# Patient Record
Sex: Female | Born: 1970 | Race: Black or African American | Hispanic: No | Marital: Married | Smoking: Never smoker
Health system: Southern US, Community
[De-identification: ages and names within clinical notes are randomized; demographics above are authoritative.]

## PROBLEM LIST (undated history)

## (undated) DIAGNOSIS — Z862 Personal history of diseases of the blood and blood-forming organs and certain disorders involving the immune mechanism: Secondary | ICD-10-CM

## (undated) DIAGNOSIS — Z8619 Personal history of other infectious and parasitic diseases: Secondary | ICD-10-CM

## (undated) DIAGNOSIS — B009 Herpesviral infection, unspecified: Secondary | ICD-10-CM

## (undated) DIAGNOSIS — A630 Anogenital (venereal) warts: Secondary | ICD-10-CM

## (undated) DIAGNOSIS — Z87898 Personal history of other specified conditions: Secondary | ICD-10-CM

## (undated) DIAGNOSIS — E28319 Asymptomatic premature menopause: Secondary | ICD-10-CM

## (undated) DIAGNOSIS — K8020 Calculus of gallbladder without cholecystitis without obstruction: Secondary | ICD-10-CM

## (undated) DIAGNOSIS — R896 Abnormal cytological findings in specimens from other organs, systems and tissues: Secondary | ICD-10-CM

## (undated) DIAGNOSIS — Z8742 Personal history of other diseases of the female genital tract: Secondary | ICD-10-CM

## (undated) DIAGNOSIS — B379 Candidiasis, unspecified: Secondary | ICD-10-CM

## (undated) DIAGNOSIS — Z87440 Personal history of urinary (tract) infections: Secondary | ICD-10-CM

## (undated) DIAGNOSIS — Z8739 Personal history of other diseases of the musculoskeletal system and connective tissue: Secondary | ICD-10-CM

## (undated) DIAGNOSIS — K219 Gastro-esophageal reflux disease without esophagitis: Secondary | ICD-10-CM

## (undated) DIAGNOSIS — IMO0002 Reserved for concepts with insufficient information to code with codable children: Secondary | ICD-10-CM

## (undated) HISTORY — DX: Personal history of other specified conditions: Z87.898

## (undated) HISTORY — DX: Personal history of diseases of the blood and blood-forming organs and certain disorders involving the immune mechanism: Z86.2

## (undated) HISTORY — DX: Herpesviral infection, unspecified: B00.9

## (undated) HISTORY — DX: Personal history of other diseases of the female genital tract: Z87.42

## (undated) HISTORY — PX: MOUTH SURGERY: SHX715

## (undated) HISTORY — DX: Anogenital (venereal) warts: A63.0

## (undated) HISTORY — PX: WRIST FRACTURE SURGERY: SHX121

## (undated) HISTORY — DX: Personal history of urinary (tract) infections: Z87.440

## (undated) HISTORY — DX: Abnormal cytological findings in specimens from other organs, systems and tissues: R89.6

## (undated) HISTORY — DX: Asymptomatic premature menopause: E28.319

## (undated) HISTORY — DX: Reserved for concepts with insufficient information to code with codable children: IMO0002

## (undated) HISTORY — DX: Personal history of other infectious and parasitic diseases: Z86.19

## (undated) HISTORY — PX: COLONOSCOPY: SHX174

## (undated) HISTORY — DX: Candidiasis, unspecified: B37.9

---
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
## 1992-03-06 HISTORY — PX: CRYOTHERAPY: SHX1416

## 1992-07-04 DIAGNOSIS — IMO0002 Reserved for concepts with insufficient information to code with codable children: Secondary | ICD-10-CM

## 1992-07-04 HISTORY — DX: Reserved for concepts with insufficient information to code with codable children: IMO0002

## 1998-10-02 ENCOUNTER — Encounter: Payer: Self-pay | Admitting: Emergency Medicine

## 1998-10-02 ENCOUNTER — Emergency Department (HOSPITAL_COMMUNITY): Admission: EM | Admit: 1998-10-02 | Discharge: 1998-10-02 | Payer: Self-pay | Admitting: Emergency Medicine

## 2000-08-07 ENCOUNTER — Other Ambulatory Visit: Admission: RE | Admit: 2000-08-07 | Discharge: 2000-08-07 | Payer: Self-pay | Admitting: *Deleted

## 2001-05-31 ENCOUNTER — Other Ambulatory Visit: Admission: RE | Admit: 2001-05-31 | Discharge: 2001-05-31 | Payer: Self-pay | Admitting: Family Medicine

## 2002-03-06 HISTORY — PX: FOOT SURGERY: SHX648

## 2002-07-09 ENCOUNTER — Other Ambulatory Visit: Admission: RE | Admit: 2002-07-09 | Discharge: 2002-07-09 | Payer: Self-pay | Admitting: Obstetrics and Gynecology

## 2003-07-21 ENCOUNTER — Other Ambulatory Visit: Admission: RE | Admit: 2003-07-21 | Discharge: 2003-07-21 | Payer: Self-pay | Admitting: Obstetrics and Gynecology

## 2004-01-27 ENCOUNTER — Ambulatory Visit (HOSPITAL_COMMUNITY): Admission: RE | Admit: 2004-01-27 | Discharge: 2004-01-27 | Payer: Self-pay | Admitting: Obstetrics and Gynecology

## 2004-02-22 ENCOUNTER — Inpatient Hospital Stay (HOSPITAL_COMMUNITY): Admission: AD | Admit: 2004-02-22 | Discharge: 2004-02-25 | Payer: Self-pay | Admitting: Obstetrics and Gynecology

## 2004-03-02 DIAGNOSIS — Z87898 Personal history of other specified conditions: Secondary | ICD-10-CM

## 2004-03-02 HISTORY — DX: Personal history of other specified conditions: Z87.898

## 2005-04-14 ENCOUNTER — Other Ambulatory Visit: Admission: RE | Admit: 2005-04-14 | Discharge: 2005-04-14 | Payer: Self-pay | Admitting: Obstetrics and Gynecology

## 2006-11-16 DIAGNOSIS — IMO0001 Reserved for inherently not codable concepts without codable children: Secondary | ICD-10-CM

## 2006-11-16 HISTORY — DX: Reserved for inherently not codable concepts without codable children: IMO0001

## 2009-02-15 DIAGNOSIS — E28319 Asymptomatic premature menopause: Secondary | ICD-10-CM

## 2009-02-15 DIAGNOSIS — Z8742 Personal history of other diseases of the female genital tract: Secondary | ICD-10-CM

## 2009-02-15 HISTORY — DX: Asymptomatic premature menopause: E28.319

## 2009-02-15 HISTORY — DX: Personal history of other diseases of the female genital tract: Z87.42

## 2010-04-20 ENCOUNTER — Emergency Department (HOSPITAL_COMMUNITY): Payer: BC Managed Care – PPO

## 2010-04-20 ENCOUNTER — Emergency Department (HOSPITAL_COMMUNITY)
Admission: EM | Admit: 2010-04-20 | Discharge: 2010-04-20 | Disposition: A | Discharge status: Home / Self Care | Payer: BC Managed Care – PPO | Attending: Emergency Medicine | Admitting: Emergency Medicine

## 2010-04-20 DIAGNOSIS — M25539 Pain in unspecified wrist: Secondary | ICD-10-CM | POA: Insufficient documentation

## 2010-04-20 DIAGNOSIS — W108XXA Fall (on) (from) other stairs and steps, initial encounter: Secondary | ICD-10-CM | POA: Insufficient documentation

## 2010-04-20 DIAGNOSIS — S52539A Colles' fracture of unspecified radius, initial encounter for closed fracture: Secondary | ICD-10-CM | POA: Insufficient documentation

## 2011-07-12 ENCOUNTER — Ambulatory Visit: Payer: Self-pay | Admitting: Obstetrics and Gynecology

## 2011-09-06 ENCOUNTER — Ambulatory Visit: Payer: BC Managed Care – PPO | Admitting: Obstetrics and Gynecology

## 2011-09-06 ENCOUNTER — Other Ambulatory Visit: Payer: Self-pay | Admitting: Obstetrics and Gynecology

## 2011-09-11 ENCOUNTER — Encounter: Payer: Self-pay | Admitting: Obstetrics and Gynecology

## 2011-10-30 ENCOUNTER — Ambulatory Visit (INDEPENDENT_AMBULATORY_CARE_PROVIDER_SITE_OTHER): Payer: BC Managed Care – PPO | Admitting: Obstetrics and Gynecology

## 2011-10-30 ENCOUNTER — Encounter: Payer: Self-pay | Admitting: Obstetrics and Gynecology

## 2011-10-30 VITALS — BP 108/60 | HR 88 | Ht 65.5 in | Wt 212.0 lb

## 2011-10-30 DIAGNOSIS — Z01419 Encounter for gynecological examination (general) (routine) without abnormal findings: Secondary | ICD-10-CM

## 2011-10-30 DIAGNOSIS — Z124 Encounter for screening for malignant neoplasm of cervix: Secondary | ICD-10-CM

## 2011-10-30 NOTE — Progress Notes (Signed)
The patient reports:no complaints  Contraception:oral contraceptives (estrogen/progesterone)  Last mammogram: not applicable  Last pap: was abnormal: ASCUS  May  2012  GC/Chlamydia cultures offered: declined HIV/RPR/HbsAg offered:  declined HSV 1 and 2 glycoprotein offered: declined  Menstrual cycle regular and monthly: Yes Menstrual flow normal: No: lighter than normal  Urinary symptoms: none Normal bowel movements: Yes Reports abuse at home: No:   Subjective:    Samantha Rollins is a 41 y.o. female, G2P1011, who presents for an annual exam. Ortho-tricyclen and satisfied    History   Social History  . Marital Status: Married    Spouse Name: N/A    Number of Children: N/A  . Years of Education: N/A   Social History Main Topics  . Smoking status: Never Smoker   . Smokeless tobacco: Never Used  . Alcohol Use: No  . Drug Use: No  . Sexually Active: Yes    Birth Control/ Protection: Pill     orthotricyclen   Other Topics Concern  . None   Social History Narrative  . None    Menstrual cycle:   LMP: Patient's last menstrual period was 10/12/2011.           Cycle: normal and light  The following portions of the patient's history were reviewed and updated as appropriate: allergies, current medications, past family history, past medical history, past social history, past surgical history and problem list.  Review of Systems Pertinent items are noted in HPI. Breast:Negative for breast lump,nipple discharge or nipple retraction Gastrointestinal: Negative for abdominal pain, change in bowel habits or rectal bleeding Urinary:negative   Objective:    BP 108/60  Pulse 88  Ht 5' 5.5" (1.664 m)  Wt 212 lb (96.163 kg)  BMI 34.74 kg/m2  LMP 10/12/2011    Weight:  Wt Readings from Last 1 Encounters:  10/30/11 212 lb (96.163 kg)          BMI: Body mass index is 34.74 kg/(m^2).  General Appearance: Alert, appropriate appearance for age. No acute distress HEENT: Grossly  normal Neck / Thyroid: Supple, no masses, nodes or enlargement Lungs: clear to auscultation bilaterally Back: No CVA tenderness Breast Exam: No masses or nodes.No dimpling, nipple retraction or discharge. Cardiovascular: Regular rate and rhythm. S1, S2, no murmur Gastrointestinal: Soft, non-tender, no masses or organomegaly Pelvic Exam: Vulva and vagina appear normal. Bimanual exam reveals normal uterus and adnexa. Rectovaginal: not indicated Lymphatic Exam: Non-palpable nodes in neck, clavicular, axillary, or inguinal regions Skin: no rash or abnormalities Neurologic: Normal gait and speech, no tremor  Psychiatric: Alert and oriented, appropriate affect.     Assessment:    Normal gyn exam    Plan:    mammogram pap smear return annually or prn STD screening: declined Contraception:oral contraceptives (estrogen/progesterone): Ortho-tricyclen     MD

## 2011-11-01 LAB — PAP IG W/ RFLX HPV ASCU

## 2012-11-11 ENCOUNTER — Other Ambulatory Visit: Payer: Self-pay

## 2012-11-11 DIAGNOSIS — Z1231 Encounter for screening mammogram for malignant neoplasm of breast: Secondary | ICD-10-CM

## 2012-11-26 ENCOUNTER — Ambulatory Visit
Admission: RE | Admit: 2012-11-26 | Discharge: 2012-11-26 | Disposition: A | Discharge status: Home / Self Care | Payer: BC Managed Care – PPO | Source: Ambulatory Visit

## 2012-11-26 DIAGNOSIS — Z1231 Encounter for screening mammogram for malignant neoplasm of breast: Secondary | ICD-10-CM

## 2012-11-28 ENCOUNTER — Other Ambulatory Visit: Payer: Self-pay | Admitting: Obstetrics and Gynecology

## 2012-11-28 DIAGNOSIS — R928 Other abnormal and inconclusive findings on diagnostic imaging of breast: Secondary | ICD-10-CM

## 2012-12-12 ENCOUNTER — Ambulatory Visit
Admission: RE | Admit: 2012-12-12 | Discharge: 2012-12-12 | Disposition: A | Discharge status: Home / Self Care | Payer: BC Managed Care – PPO | Source: Ambulatory Visit | Attending: Obstetrics and Gynecology | Admitting: Obstetrics and Gynecology

## 2012-12-12 DIAGNOSIS — R928 Other abnormal and inconclusive findings on diagnostic imaging of breast: Secondary | ICD-10-CM

## 2013-12-04 ENCOUNTER — Other Ambulatory Visit: Payer: Self-pay

## 2013-12-04 DIAGNOSIS — Z1239 Encounter for other screening for malignant neoplasm of breast: Secondary | ICD-10-CM

## 2013-12-17 ENCOUNTER — Encounter (INDEPENDENT_AMBULATORY_CARE_PROVIDER_SITE_OTHER): Payer: Self-pay

## 2013-12-17 ENCOUNTER — Ambulatory Visit
Admission: RE | Admit: 2013-12-17 | Discharge: 2013-12-17 | Disposition: A | Discharge status: Home / Self Care | Payer: BC Managed Care – PPO | Source: Ambulatory Visit

## 2013-12-17 DIAGNOSIS — Z1239 Encounter for other screening for malignant neoplasm of breast: Secondary | ICD-10-CM

## 2014-01-05 ENCOUNTER — Encounter: Payer: Self-pay | Admitting: Obstetrics and Gynecology

## 2014-08-31 ENCOUNTER — Encounter (HOSPITAL_COMMUNITY): Payer: Self-pay | Admitting: Emergency Medicine

## 2014-08-31 ENCOUNTER — Emergency Department (HOSPITAL_COMMUNITY): Payer: BLUE CROSS/BLUE SHIELD

## 2014-08-31 ENCOUNTER — Inpatient Hospital Stay (HOSPITAL_COMMUNITY)
Admission: EM | Admit: 2014-08-31 | Discharge: 2014-09-01 | DRG: 176 | Disposition: A | Discharge status: Home / Self Care | Payer: BLUE CROSS/BLUE SHIELD | Attending: Internal Medicine | Admitting: Internal Medicine

## 2014-08-31 DIAGNOSIS — I2699 Other pulmonary embolism without acute cor pulmonale: Secondary | ICD-10-CM | POA: Diagnosis not present

## 2014-08-31 DIAGNOSIS — D72829 Elevated white blood cell count, unspecified: Secondary | ICD-10-CM | POA: Diagnosis present

## 2014-08-31 DIAGNOSIS — E875 Hyperkalemia: Secondary | ICD-10-CM | POA: Diagnosis present

## 2014-08-31 DIAGNOSIS — M25512 Pain in left shoulder: Secondary | ICD-10-CM | POA: Diagnosis present

## 2014-08-31 DIAGNOSIS — D573 Sickle-cell trait: Secondary | ICD-10-CM | POA: Diagnosis present

## 2014-08-31 DIAGNOSIS — Z79899 Other long term (current) drug therapy: Secondary | ICD-10-CM | POA: Diagnosis not present

## 2014-08-31 HISTORY — DX: Other pulmonary embolism without acute cor pulmonale: I26.99

## 2014-08-31 LAB — BASIC METABOLIC PANEL
Anion gap: 9 (ref 5–15)
BUN: 8 mg/dL (ref 6–20)
CHLORIDE: 104 mmol/L (ref 101–111)
CO2: 22 mmol/L (ref 22–32)
CREATININE: 0.84 mg/dL (ref 0.44–1.00)
Calcium: 8.9 mg/dL (ref 8.9–10.3)
GFR calc Af Amer: >60 mL/min (ref >60)
GFR calc non Af Amer: >60 mL/min (ref >60)
Glucose, Bld: 102 mg/dL — ABNORMAL HIGH (ref 65–99)
Potassium: 5.3 mmol/L — ABNORMAL HIGH (ref 3.5–5.1)
SODIUM: 135 mmol/L (ref 135–145)

## 2014-08-31 LAB — CBC
HCT: 37.4 % (ref 36.0–46.0)
HEMOGLOBIN: 12.8 g/dL (ref 12.0–15.0)
MCH: 27.9 pg (ref 26.0–34.0)
MCHC: 34.2 g/dL (ref 30.0–36.0)
MCV: 81.7 fL (ref 78.0–100.0)
Platelets: 427 10*3/uL — ABNORMAL HIGH (ref 150–400)
RBC: 4.58 MIL/uL (ref 3.87–5.11)
RDW: 12.7 % (ref 11.5–15.5)
WBC: 12.3 10*3/uL — AB (ref 4.0–10.5)

## 2014-08-31 LAB — I-STAT BETA HCG BLOOD, ED (MC, WL, AP ONLY)

## 2014-08-31 LAB — I-STAT TROPONIN, ED: Troponin i, poc: 0 ng/mL (ref 0.00–0.08)

## 2014-08-31 MED ORDER — SODIUM CHLORIDE 0.9 % IV BOLUS (SEPSIS)
1000.0000 mL | Freq: Once | INTRAVENOUS | Status: AC
  Administered 2014-08-31: 1000 mL via INTRAVENOUS

## 2014-08-31 MED ORDER — HEPARIN BOLUS VIA INFUSION
5000.0000 [IU] | Freq: Once | INTRAVENOUS | Status: AC
  Administered 2014-08-31: 5000 [IU] via INTRAVENOUS
  Filled 2014-08-31: qty 5000

## 2014-08-31 MED ORDER — SODIUM CHLORIDE 0.9 % IV SOLN
INTRAVENOUS | Status: DC
  Administered 2014-09-01: 125 mL/h via INTRAVENOUS

## 2014-08-31 MED ORDER — MORPHINE SULFATE 4 MG/ML IJ SOLN
4.0000 mg | Freq: Once | INTRAMUSCULAR | Status: AC
  Administered 2014-08-31: 2 mg via INTRAVENOUS
  Filled 2014-08-31: qty 1

## 2014-08-31 MED ORDER — IOHEXOL 350 MG/ML SOLN
100.0000 mL | Freq: Once | INTRAVENOUS | Status: AC | PRN
  Administered 2014-08-31: 100 mL via INTRAVENOUS

## 2014-08-31 MED ORDER — HEPARIN (PORCINE) IN NACL 100-0.45 UNIT/ML-% IJ SOLN
1350.0000 [IU]/h | INTRAMUSCULAR | Status: DC
  Administered 2014-08-31: 1350 [IU]/h via INTRAVENOUS
  Filled 2014-08-31 (×2): qty 250

## 2014-08-31 NOTE — ED Provider Notes (Signed)
CSN: [id number]     Arrival date & time 08/31/14  1729 History   First MD Initiated Contact with Patient 08/31/14 1902     Chief Complaint  Patient presents with  . Shortness of Breath     (Consider location/radiation/quality/duration/timing/severity/associated sxs/prior Treatment) Patient is a 44 y.o. female presenting with shoulder pain.  Shoulder Pain Location:  Shoulder Time since incident:  31 hours Injury: no   Shoulder location:  L shoulder Pain details:    Quality:  Aching   Radiates to:  Does not radiate   Severity:  Moderate   Onset quality:  Sudden   Timing:  Constant   Progression:  Unchanged Chronicity:  New Handedness:  Right-handed Dislocation: no   Relieved by:  Rest Worsened by:  Movement (and breathing) Ineffective treatments:  NSAIDs Associated symptoms: no back pain, no decreased range of motion, no fatigue, no fever, no muscle weakness, no neck pain, no numbness, no stiffness, no swelling and no tingling     Past Medical History  Diagnosis Date  . Herpes simplex type 2 infection   . 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 Hx of candidiasis   . H/O varicella   . Hx: UTI (urinary tract infection)   . H/O sickle cell trait   . Abnormal Pap smear 1994-5  . Yeast infection   . H/O bacterial infection   . H/O diarrhea 03/02/04  . ASCUS (atypical squamous cells of undetermined significance) on Pap smear 11/16/06  . Hx of amenorrhea 02/15/09  . Premature menopause 02/15/09   Past Surgical History  Procedure Laterality Date  . Mandible surgery  1993  . Foot surgery  2004  . Cryotherapy  1994   Family History  Problem Relation Age of Onset  . Hypertension Mother   . Heart disease Maternal Grandmother     MI   History  Substance Use Topics  . Smoking status: Never Smoker   . Smokeless tobacco: Never Used  . Alcohol Use: No   OB History    Gravida Para Term Preterm AB TAB SAB Ectopic Multiple Living   2 1 1  1 1    1      Review of Systems   Constitutional: Negative for fever, chills, appetite change and fatigue.  HENT: Negative for congestion, ear pain, facial swelling, mouth sores and sore throat.   Eyes: Negative for visual disturbance.  Respiratory: Negative for cough, chest tightness and shortness of breath.   Cardiovascular: Negative for chest pain and palpitations.  Gastrointestinal: Negative for nausea, vomiting, abdominal pain, diarrhea and blood in stool.  Endocrine: Negative for cold intolerance and heat intolerance.  Genitourinary: Negative for frequency, decreased urine volume and difficulty urinating.  Musculoskeletal: Negative for back pain, stiffness, neck pain and neck stiffness.  Skin: Negative for rash.  Neurological: Negative for dizziness, weakness, light-headedness and headaches.  All other systems reviewed and are negative.     Allergies  Review of patient's allergies indicates no known allergies.  Home Medications   Prior to Admission medications   Medication Sig Start Date End Date Taking? Authorizing Provider  TRI-PREVIFEM 0.18/0.215/0.25 MG-35 MCG tablet TAKE 1 TABLET DAILY 09/06/11  Yes Silverio Lay, MD   BP 130/47 mmHg  Pulse 103  Temp(Src) 99.2 F (37.3 C) (Oral)  Resp 18  Ht 5\' 5"  (1.651 m)  Wt 227 lb 3.2 oz (103.057 kg)  BMI 37.81 kg/m2  SpO2 98%  LMP 07/31/2014 Physical Exam  Constitutional: She is oriented to person, place, and  time. She appears well-developed and well-nourished. No distress.  HENT:  Head: Normocephalic and atraumatic.  Right Ear: External ear normal.  Left Ear: External ear normal.  Nose: Nose normal.  Eyes: Conjunctivae and EOM are normal. Pupils are equal, round, and reactive to light. Right eye exhibits no discharge. Left eye exhibits no discharge. No scleral icterus.  Neck: Normal range of motion. Neck supple.  Cardiovascular: Normal rate, regular rhythm and normal heart sounds.  Exam reveals no gallop and no friction rub.   No murmur  heard. Pulmonary/Chest: Effort normal and breath sounds normal. No stridor. No respiratory distress. She has no wheezes.  Abdominal: Soft. She exhibits no distension. There is no tenderness.  Musculoskeletal: She exhibits no edema.       Left shoulder: She exhibits tenderness. Decreased range of motion: to middle fibers of left Trap.  Neurological: She is alert and oriented to person, place, and time.  Skin: Skin is warm and dry. No rash noted. She is not diaphoretic. No erythema.  Psychiatric: She has a normal mood and affect.    ED Course  Procedures (including critical care time) Labs Review Labs Reviewed  CBC - Abnormal; Notable for the following:    WBC 12.3 (*)    Platelets 427 (*)    All other components within normal limits  BASIC METABOLIC PANEL - Abnormal; Notable for the following:    Potassium 5.3 (*)    Glucose, Bld 102 (*)    All other components within normal limits  POTASSIUM  CBC  HEPARIN LEVEL (UNFRACTIONATED)  ANTITHROMBIN III  PROTEIN C ACTIVITY  PROTEIN C, TOTAL  PROTEIN S ACTIVITY  PROTEIN S, TOTAL  LUPUS ANTICOAGULANT PANEL  BETA-2-GLYCOPROTEIN I ABS, IGG/M/A  HOMOCYSTEINE  FACTOR 5 LEIDEN  PROTHROMBIN GENE MUTATION  CARDIOLIPIN ANTIBODIES, IGG, IGM, IGA  C-REACTIVE PROTEIN  SEDIMENTATION RATE  PROTIME-INR  APTT  COMPREHENSIVE METABOLIC PANEL  URINE RAPID DRUG SCREEN, HOSP PERFORMED  HIV ANTIBODY (ROUTINE TESTING)  ANTINUCLEAR ANTIBODIES, IFA  I-STAT TROPOININ, ED  I-STAT BETA HCG BLOOD, ED ([facility], [facility], [facility] ONLY)    Imaging Review Dg Chest 2 View  08/31/2014   CLINICAL DATA:  Shortness of breath, elevated D-dimer level.  EXAM: CHEST  2 VIEW  COMPARISON:  None.  FINDINGS: The heart size and mediastinal contours are within normal limits. No pneumothorax is noted. Right lung is clear. Mild left basilar opacity is noted concerning for pneumonia or subsegmental atelectasis. The visualized skeletal structures are unremarkable.  IMPRESSION: Mild left  basilar opacity is noted concerning for pneumonia or subsegmental atelectasis. Follow-up radiographs are recommended to ensure resolution.   Electronically Signed   By: Lupita Raider, M.D.   On: 08/31/2014 19:27   Ct Angio Chest Pe W/cm &/or Wo Cm  08/31/2014   CLINICAL DATA:  Shortness of breath. Left shoulder and upper back pain. Positive D-dimer.  EXAM: CT ANGIOGRAPHY CHEST WITH CONTRAST  TECHNIQUE: Multidetector CT imaging of the chest was performed using the standard protocol during bolus administration of intravenous contrast. Multiplanar CT image reconstructions and MIPs were obtained to evaluate the vascular anatomy.  CONTRAST:  OMNIPAQUE IOHEXOL 350 MG/ML SOLN  COMPARISON:  Radiographs earlier this day.  FINDINGS: Examination is positive for pulmonary emboli. There are filling defects in the lobar branch to the left lower lobe, extending into the segmental and subsegmental branches laterally. No definite right-sided filling defects. There is no evidence right heart strain the RV to LV ratio is 0.84.  Ill-defined opacities in  the left lower lobe, majority appears to represent atelectasis, however there is a peripheral density at the lateral basal segment that may reflect pulmonary infarct. There is a small left pleural effusion.  No mediastinal or hilar adenopathy. No pericardial effusion. No focal consolidation in the right lung, minimal atelectasis in the right lower lobe.  No acute abnormality in the included upper abdomen.  There are no acute or suspicious osseous abnormalities. Minimal degenerative disc disease in the mid thoracic spine.  Review of the MIP images confirms the above findings.  IMPRESSION: Positive for pulmonary embolus in the left lower lobe. No evidence of right heart strain. Atelectasis and probable minimal pulmonary infarct in the left lower lobe.  Critical Value/emergent results were called by telephone at the time of interpretation on 08/31/2014 at 10:32 pm to Dr. Drema PryPEDRO  CARDAMA , who verbally acknowledged these results.   Electronically Signed   By: Rubye OaksMelanie  Ehinger M.D.   On: 08/31/2014 22:34     EKG Interpretation   Date/Time:  Monday August 31 2014 17:43:45 EDT Ventricular Rate:  101 PR Interval:  140 QRS Duration: 76 QT Interval:  316 QTC Calculation: 409 R Axis:   36 Text Interpretation:  Sinus tachycardia Otherwise normal ECG No previous  ECGs available Confirmed by Manus GunningANCOUR  MD, STEPHEN [phone]([id number]) on 08/31/2014  7:53:24 PM      MDM   44 year old female presents from PCP clinic for assessment of possible pulmonary embolism given left shoulder pain with a positive d-dimer. History and exam as above. CTA revealed left lower lobe pulmonary embolism with evidence of pulmonary infarct. Patient started on heparin and was admitted to medicine for further management and workup. Patient has remained hemodynamically stable without hypoxia.  She was seen in conjunction with Dr. Manus Gunningancour.  Final diagnoses:  PE (pulmonary embolism)        Drema PryPedro Cardama, MD 09/01/14 [id number]  Glynn OctaveStephen Rancour, MD 09/01/14 [id number]

## 2014-08-31 NOTE — Progress Notes (Signed)
ANTICOAGULATION CONSULT NOTE - Initial Consult  Pharmacy Consult for Heparin Indication: pulmonary embolus  No Known Allergies  Patient Measurements: Height: 5\' 5"  (165.1 cm) Weight: 223 lb (101.152 kg) IBW/kg (Calculated) : 57 Heparin Dosing Weight: 80 kg  Vital Signs: Temp: 98.4 F (36.9 C) (06/27 1734) Temp Source: Oral (06/27 1734) BP: 127/55 mmHg (06/27 2030) Pulse Rate: 100 (06/27 2030)  Labs:  Recent Labs  08/31/14 1855  HGB 12.8  HCT 37.4  PLT 427*  CREATININE 0.84    Estimated Creatinine Clearance: 103.9 mL/min (by C-G formula based on Cr of 0.84).   Medical History: Past Medical History  Diagnosis Date  . Herpes simplex type 2 infection   . 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 Hx of candidiasis   . H/O varicella   . Hx: UTI (urinary tract infection)   . H/O sickle cell trait   . Abnormal Pap smear 1994-5  . Yeast infection   . H/O bacterial infection   . H/O diarrhea 03/02/04  . ASCUS (atypical squamous cells of undetermined significance) on Pap smear 11/16/06  . Hx of amenorrhea 02/15/09  . Premature menopause 02/15/09    Medications:   (Not in a hospital admission) Scheduled:  Infusions:   Assessment: 44yo female presents with SOB. Pharmacy is consulted to dose heparin for PE. Hgb 12.8, Plt 427, sCr 0.8. CT chest is positive for PE in LLL with no evidence of R heart strain.  Goal of Therapy:  Heparin level 0.3-0.7 units/ml Monitor platelets by anticoagulation protocol: Yes   Plan:  Give 5000 units bolus x 1 Start heparin infusion at 1350 units/hr Check anti-Xa level in 6 hours and daily while on heparin Continue to monitor H&H and platelets  Arlean Hoppingorey M. Newman PiesBall, PharmD Clinical Pharmacist Pager [phone] 08/31/2014,10:38 PM

## 2014-08-31 NOTE — H&P (Signed)
[facility] History and Physical  Samantha Rollins XBW:[id number] DOB: Jul 01, 1970 DOA: 08/31/2014  Referring physician: ED physician PCP: Lynne Logan, MD  Specialists:   Chief Complaint: Left shoulder pain  HPI: Samantha Rollins is a 44 y.o. female with PMH of sickle cell trait, who presents with left shoulder pain.  Patient reports that because of left shoulder pain, she was evaluated in [facility] today. She had positive d-dimer, and was sent to ED for further evaluation to rule out PE. Her shoulder pain is aggravated by deep breath, therefore she can not take deep breath. She has very mild SOB. She does not have cough, chest pain, tenderness over the calf area. She reports that she had long distance traveling by airplane for 2 hours 2 weeks ago. Her maternal grandmother had blood clot. She is currently taking oral contraception. Patient does not have fever, chills, abdominal pain, diarrhea, symptoms of UTI, unilateral weakness.  In ED, patient was found to have left lower lobe PE by CT angiogram, without right heart straining. WBC 12.3, temperature normal, tachycardia, troponin negative, potassium 5.3 without EKG change. Patient is admitted to inpatient for further evaluation and treatment.   Where does patient live?   At home    Can patient participate in ADLs?  Yes     Review of Systems:   General: no fevers, chills, no changes in body weight, has fatigue HEENT: no blurry vision, hearing changes or sore throat Pulm: mild dyspnea, no coughing, wheezing CV: no chest pain, palpitations Abd: no nausea, vomiting, abdominal pain, diarrhea, constipation GU: no dysuria, burning on urination, increased urinary frequency, hematuria  Ext: no leg edema Neuro: no unilateral weakness, numbness, or tingling, no vision change or hearing loss Skin: no rash MSK: has left shoulder pain. No muscle spasm, no deformity, no limitation of range of movement in spin Heme: No easy bruising.  Travel  history: No recent long distant travel.  Allergy: No Known Allergies  Past Medical History  Diagnosis Date  . Herpes simplex type 2 infection   . 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 Hx of candidiasis   . H/O varicella   . Hx: UTI (urinary tract infection)   . H/O sickle cell trait   . Abnormal Pap smear 1994-5  . Yeast infection   . H/O bacterial infection   . H/O diarrhea 03/02/04  . ASCUS (atypical squamous cells of undetermined significance) on Pap smear 11/16/06  . Hx of amenorrhea 02/15/09  . Premature menopause 02/15/09    Past Surgical History  Procedure Laterality Date  . Mandible surgery  1993  . Foot surgery  2004  . Cryotherapy  1994    Social History:  reports that she has never smoked. She has never used smokeless tobacco. She reports that she does not drink alcohol or use illicit drugs.  Family History:  Family History  Problem Relation Age of Onset  . Hypertension Mother   . Heart disease Maternal Grandmother     MI     Prior to Admission medications   Medication Sig Start Date End Date Taking? Authorizing Provider  TRI-PREVIFEM 0.18/0.215/0.25 MG-35 MCG tablet TAKE 1 TABLET DAILY 09/06/11  Yes Delsa Bern, MD    Physical Exam: Filed Vitals:   08/31/14 2000 08/31/14 2030 08/31/14 2239 08/31/14 2240  BP: 141/59 127/55 135/73   Pulse: 107 100  111  Temp:      TempSrc:      Resp: 26 25  32  Height:  Weight:      SpO2: 100% 100%  98%   General: Not in acute distress HEENT:       Eyes: PERRL, EOMI, no scleral icterus.       ENT: No discharge from the ears and nose, no pharynx injection, no tonsillar enlargement.        Neck: No JVD, no bruit, no mass felt. Heme: No neck lymph node enlargement. Cardiac: S1/S2, RRR, No murmurs, No gallops or rubs. Pulm:  No rales, wheezing, rhonchi or rubs. Abd: Soft, nondistended, nontender, no rebound pain, no organomegaly, BS present. Ext: No pitting leg edema bilaterally. 2+DP/PT pulse bilaterally. No  tenderness over calf areas. Musculoskeletal: No joint deformities, No joint redness or warmth, no limitation of ROM in spin. Skin: No rashes.  Neuro: Alert, oriented X3, cranial nerves II-XII grossly intact, muscle strength 5/5 in all extremities, sensation to light touch intact. Psych: Patient is not psychotic, no suicidal or hemocidal ideation.  Labs on Admission:  Basic Metabolic Panel:  Recent Labs Lab 08/31/14 1855  NA 135  K 5.3*  CL 104  CO2 22  GLUCOSE 102*  BUN 8  CREATININE 0.84  CALCIUM 8.9   Liver Function Tests: No results for input(s): AST, ALT, ALKPHOS, BILITOT, PROT, ALBUMIN in the last 168 hours. No results for input(s): LIPASE, AMYLASE in the last 168 hours. No results for input(s): AMMONIA in the last 168 hours. CBC:  Recent Labs Lab 08/31/14 1855  WBC 12.3*  HGB 12.8  HCT 37.4  MCV 81.7  PLT 427*   Cardiac Enzymes: No results for input(s): CKTOTAL, CKMB, CKMBINDEX, TROPONINI in the last 168 hours.  BNP (last 3 results) No results for input(s): BNP in the last 8760 hours.  ProBNP (last 3 results) No results for input(s): PROBNP in the last 8760 hours.  CBG: No results for input(s): GLUCAP in the last 168 hours.  Radiological Exams on Admission: Dg Chest 2 View  08/31/2014   CLINICAL DATA:  Shortness of breath, elevated D-dimer level.  EXAM: CHEST  2 VIEW  COMPARISON:  None.  FINDINGS: The heart size and mediastinal contours are within normal limits. No pneumothorax is noted. Right lung is clear. Mild left basilar opacity is noted concerning for pneumonia or subsegmental atelectasis. The visualized skeletal structures are unremarkable.  IMPRESSION: Mild left basilar opacity is noted concerning for pneumonia or subsegmental atelectasis. Follow-up radiographs are recommended to ensure resolution.   Electronically Signed   By: Marijo Conception, M.D.   On: 08/31/2014 19:27   Ct Angio Chest Pe W/cm &/or Wo Cm  08/31/2014   CLINICAL DATA:  Shortness of  breath. Left shoulder and upper back pain. Positive D-dimer.  EXAM: CT ANGIOGRAPHY CHEST WITH CONTRAST  TECHNIQUE: Multidetector CT imaging of the chest was performed using the standard protocol during bolus administration of intravenous contrast. Multiplanar CT image reconstructions and MIPs were obtained to evaluate the vascular anatomy.  CONTRAST:  179m OMNIPAQUE IOHEXOL 350 MG/ML SOLN  COMPARISON:  Radiographs earlier this day.  FINDINGS: Examination is positive for pulmonary emboli. There are filling defects in the lobar branch to the left lower lobe, extending into the segmental and subsegmental branches laterally. No definite right-sided filling defects. There is no evidence right heart strain the RV to LV ratio is 0.84.  Ill-defined opacities in the left lower lobe, majority appears to represent atelectasis, however there is a peripheral density at the lateral basal segment that may reflect pulmonary infarct. There is a small left pleural  effusion.  No mediastinal or hilar adenopathy. No pericardial effusion. No focal consolidation in the right lung, minimal atelectasis in the right lower lobe.  No acute abnormality in the included upper abdomen.  There are no acute or suspicious osseous abnormalities. Minimal degenerative disc disease in the mid thoracic spine.  Review of the MIP images confirms the above findings.  IMPRESSION: Positive for pulmonary embolus in the left lower lobe. No evidence of right heart strain. Atelectasis and probable minimal pulmonary infarct in the left lower lobe.  Critical Value/emergent results were called by telephone at the time of interpretation on 08/31/2014 at 10:32 pm to Dr. Addison Lank , who verbally acknowledged these results.   Electronically Signed   By: Jeb Levering M.D.   On: 08/31/2014 22:34    EKG: Independently reviewed.  Abnormal findings:   Tachycardia, no T-wave peaking  Assessment/Plan Principal Problem:   PE (pulmonary embolism) Active  Problems:   Hyperkalemia   Leukocytosis   Pulmonary Embolus: patient has left lower lobe PE, without right heart straining as evidenced by the CT angiogram. Her risk factors include recent long distance traveling, OCP use, and family history of blood clot ( internal grandmother)  -admit to tele -heparin drip initiated by ED, will continue -2D echocardiogram ordered -LE dopplers ordered to evaluate for DVT -repeat EKG in a.m. -Hypercoag panel, CRP, ESR, ANA -UDS, HIV antibody -pain control: When necessary Percocet and morphine -d/c OCP  Leukocytosis: No signs of infection. Likely due to stress induced demargination. -follow up by cbc  Hyperkalemia: Potassium 5.3 without EKG change. -iVF: 1L ns and then 125 cc/h -Repeat potassium level, if still elevated, will treat with Kayexalate. -CMP in AM    DVT ppx: on IV Heparin        Code Status: Full code Family Communication:    Yes, patient's    husband   at bed side Disposition Plan: Admit to inpatient   Date of Service 08/31/2014    Ivor Costa [facility] Pager [phone]  If 7PM-7AM, please contact night-coverage [email or web address] Password [facility] 08/31/2014, 11:38 PM

## 2014-08-31 NOTE — ED Notes (Signed)
Pt sent here from [facility] for chest CT to r/o PE. Pt had positive d-dimer. Pt c.o pain to L shoulder and pain in back with inspiration. C/o sob.

## 2014-08-31 NOTE — ED Notes (Signed)
Pt is not in the room at this time.

## 2014-09-01 ENCOUNTER — Inpatient Hospital Stay (HOSPITAL_COMMUNITY): Payer: BLUE CROSS/BLUE SHIELD

## 2014-09-01 DIAGNOSIS — D72829 Elevated white blood cell count, unspecified: Secondary | ICD-10-CM

## 2014-09-01 DIAGNOSIS — E875 Hyperkalemia: Secondary | ICD-10-CM

## 2014-09-01 DIAGNOSIS — I2699 Other pulmonary embolism without acute cor pulmonale: Principal | ICD-10-CM

## 2014-09-01 LAB — RAPID URINE DRUG SCREEN, HOSP PERFORMED
Amphetamines: NOT DETECTED
BARBITURATES: NOT DETECTED
BENZODIAZEPINES: NOT DETECTED
Cocaine: NOT DETECTED
Opiates: POSITIVE — AB
Tetrahydrocannabinol: NOT DETECTED

## 2014-09-01 LAB — PROTIME-INR
INR: 1.1 (ref 0.00–1.49)
Prothrombin Time: 14.4 seconds (ref 11.6–15.2)

## 2014-09-01 LAB — C-REACTIVE PROTEIN: CRP: 8.3 mg/dL — AB (ref <1.0)

## 2014-09-01 LAB — POTASSIUM: POTASSIUM: 3.9 mmol/L (ref 3.5–5.1)

## 2014-09-01 LAB — SEDIMENTATION RATE: Sed Rate: 63 mm/hr — ABNORMAL HIGH (ref 0–22)

## 2014-09-01 LAB — ANTITHROMBIN III: AntiThromb III Func: 78 % (ref 75–120)

## 2014-09-01 LAB — GLUCOSE, CAPILLARY: Glucose-Capillary: 102 mg/dL — ABNORMAL HIGH (ref 65–99)

## 2014-09-01 LAB — HIV ANTIBODY (ROUTINE TESTING W REFLEX): HIV Screen 4th Generation wRfx: NONREACTIVE

## 2014-09-01 LAB — APTT: aPTT: 68 seconds — ABNORMAL HIGH (ref 24–37)

## 2014-09-01 MED ORDER — RIVAROXABAN 15 MG PO TABS
15.0000 mg | ORAL_TABLET | Freq: Two times a day (BID) | ORAL | Status: DC
Start: 2014-09-01 — End: 2014-09-01
  Administered 2014-09-01: 15 mg via ORAL
  Filled 2014-09-01 (×3): qty 1

## 2014-09-01 MED ORDER — ACETAMINOPHEN 325 MG PO TABS: 650.0000 mg | ORAL_TABLET | Freq: Four times a day (QID) | ORAL | Status: DC | PRN

## 2014-09-01 MED ORDER — ALUM & MAG HYDROXIDE-SIMETH 200-200-20 MG/5ML PO SUSP: 30.0000 mL | Freq: Four times a day (QID) | ORAL | Status: DC | PRN

## 2014-09-01 MED ORDER — RIVAROXABAN (XARELTO) EDUCATION KIT FOR DVT/PE PATIENTS
PACK | Freq: Once | Status: DC
  Filled 2014-09-01: qty 1

## 2014-09-01 MED ORDER — ONDANSETRON HCL 4 MG PO TABS: 4.0000 mg | ORAL_TABLET | Freq: Four times a day (QID) | ORAL | Status: DC | PRN

## 2014-09-01 MED ORDER — RIVAROXABAN (XARELTO) VTE STARTER PACK (15 & 20 MG): ORAL_TABLET | ORAL | Status: DC

## 2014-09-01 MED ORDER — ACETAMINOPHEN 650 MG RE SUPP
650.0000 mg | Freq: Four times a day (QID) | RECTAL | Status: DC | PRN
Start: 2014-09-01 — End: 2014-09-01

## 2014-09-01 MED ORDER — OXYCODONE-ACETAMINOPHEN 5-325 MG PO TABS: 2.0000 | ORAL_TABLET | Freq: Four times a day (QID) | ORAL | Status: DC | PRN

## 2014-09-01 MED ORDER — RIVAROXABAN 20 MG PO TABS: 20.0000 mg | ORAL_TABLET | Freq: Every day | ORAL | Status: DC

## 2014-09-01 MED ORDER — SODIUM CHLORIDE 0.9 % IJ SOLN: 3.0000 mL | Freq: Two times a day (BID) | INTRAMUSCULAR | Status: DC

## 2014-09-01 MED ORDER — ONDANSETRON HCL 4 MG/2ML IJ SOLN: 4.0000 mg | Freq: Four times a day (QID) | INTRAMUSCULAR | Status: DC | PRN

## 2014-09-01 MED ORDER — MORPHINE SULFATE 2 MG/ML IJ SOLN: 2.0000 mg | INTRAMUSCULAR | Status: DC | PRN

## 2014-09-01 NOTE — Progress Notes (Signed)
UR completed.    Kaytlin Burklow W. Nathanael Krist, RN, BSN  Trauma/Neuro ICU Case Manager [phone] 

## 2014-09-01 NOTE — ED Notes (Signed)
Pt denies discomfort with heparin, and no skin symptoms noted. Heparin bolus finished.

## 2014-09-01 NOTE — Progress Notes (Signed)
At 1444  pt d/c off floor to awaiting transport.  Escorted by her husband.  Amanda PeaNellie Guerry [place], Charity fundraiserN.

## 2014-09-01 NOTE — ED Notes (Signed)
Pt reports feeling a heavy feeling in her arm after the heparin started. Area of redness and edema noted above IV, above where the pt was stuck previously. Heaviness stopped when Heparin stopped.

## 2014-09-01 NOTE — Progress Notes (Signed)
All d/c instructions explained and given to pt.  Verbalized understanding. .  Maddelyn Rocca, RN. 

## 2014-09-01 NOTE — Progress Notes (Signed)
ANTICOAGULATION CONSULT NOTE - Follow Up Consult  Pharmacy Consult for Heparin >> Xarelto Indication: pulmonary embolus  No Known Allergies  Patient Measurements: Height: 5\' 5"  (165.1 cm) Weight: 227 lb 3.2 oz (103.057 kg) (scale b) IBW/kg (Calculated) : 57  Vital Signs: Temp: 98.2 F (36.8 C) (06/28 0544) Temp Source: Oral (06/28 0544) BP: 116/65 mmHg (06/28 0544) Pulse Rate: 88 (06/28 0544)  Labs:  Recent Labs  08/31/14 1855 09/01/14 0140  HGB 12.8  --   HCT 37.4  --   PLT 427*  --   APTT  --  68*  LABPROT  --  14.4  INR  --  1.10  CREATININE 0.84  --     Estimated Creatinine Clearance: 104.9 mL/min (by C-G formula based on Cr of 0.84).   Medications:  Prescriptions prior to admission  Medication Sig Dispense Refill Last Dose  . TRI-PREVIFEM 0.18/0.215/0.25 MG-35 MCG tablet TAKE 1 TABLET DAILY 3 tablet 49 08/22/2014   Scheduled:  . sodium chloride  3 mL Intravenous Q12H   Infusions:  . sodium chloride 125 mL/hr (09/01/14 0229)  . heparin 1,350 Units/hr (09/01/14 0006)    Assessment: 44 yo F who presented to the ED last PM with new PE and was started on heparin infusion.  Plans for today to switch patient to Xarelto and discharge home.  Goal of Therapy:  therapeutic anticoagulation   Plan:  D/C heparin infusion when first Xarelto dose given. D/C heparin labs (done by MD) Xarelto 15mg  PO bid x 21 days - first dose this AM Followed by Xarelto 20mg  daily with evening meal. Xarelto Ed - specifically with regards to OC use and increased risk for clot.  Toys 'R' UsKimberly Demon Volante, Pharm.D., BCPS Clinical Pharmacist Pager [phone] 09/01/2014 10:05 AM

## 2014-09-01 NOTE — Discharge Instructions (Signed)
Information on my medicine - XARELTO (rivaroxaban)  This medication education was reviewed with me or my healthcare representative as part of my discharge preparation.  The pharmacist that spoke with me during my hospital stay was:  Toys 'R' UsKimberly Marvon Shillingburg, Pharm.D.  WHY WAS XARELTO PRESCRIBED FOR YOU? Xarelto was prescribed to treat blood clots that may have been found in the veins of your legs (deep vein thrombosis) or in your lungs (pulmonary embolism) and to reduce the risk of them occurring again.  What do you need to know about Xarelto? The starting dose is one 15 mg tablet taken TWICE daily with food for the FIRST 21 DAYS then on (enter date)  September 22, 2014  the dose is changed to one 20 mg tablet taken ONCE A DAY with your evening meal.  DO NOT stop taking Xarelto without talking to the health care provider who prescribed the medication.  Refill your prescription for 20 mg tablets before you run out.  After discharge, you should have regular check-up appointments with your healthcare provider that is prescribing your Xarelto.  In the future your dose may need to be changed if your kidney function changes by a significant amount.  What do you do if you miss a dose? If you are taking Xarelto TWICE DAILY and you miss a dose, take it as soon as you remember. You may take two 15 mg tablets (total 30 mg) at the same time then resume your regularly scheduled 15 mg twice daily the next day.  If you are taking Xarelto ONCE DAILY and you miss a dose, take it as soon as you remember on the same day then continue your regularly scheduled once daily regimen the next day. Do not take two doses of Xarelto at the same time.   Important Safety Information Xarelto is a blood thinner medicine that can cause bleeding. You should call your healthcare provider right away if you experience any of the following: ? Bleeding from an injury or your nose that does not stop. ? Unusual colored urine (red or dark  brown) or unusual colored stools (red or black). ? Unusual bruising for unknown reasons. ? A serious fall or if you hit your head (even if there is no bleeding).  Some medicines may interact with Xarelto and might increase your risk of bleeding while on Xarelto. To help avoid this, consult your healthcare provider or pharmacist prior to using any new prescription or non-prescription medications, including herbals, vitamins, non-steroidal anti-inflammatory drugs (NSAIDs) and supplements.  This website has more information on Xarelto: [email or web address].xarelto.[email or web address].

## 2014-09-01 NOTE — Discharge Summary (Signed)
Physician Discharge Summary  Samantha Rollins:[id number] DOB: [birth year] DOA: 08/31/2014  PCP: Samantha Rubenstein, MD  Admit date: 08/31/2014 Discharge date: 09/01/2014  Time spent: 40 minutes  Recommendations for Outpatient Follow-up:  1. Follow-up with primary care physician within one week.  Discharge Diagnoses:  Principal Problem:   PE (pulmonary embolism) Active Problems:   Hyperkalemia   Leukocytosis   Discharge Condition: Stable  Diet recommendation: Regular  Filed Weights   08/31/14 1734 09/01/14 0034  Weight: 101.152 kg (223 lb) 103.057 kg (227 lb 3.2 oz)    History of present illness:  Samantha Rollins is a 44 Rollins.o. female with PMH of sickle cell trait, who presents with left shoulder pain.  Patient reports that because of left shoulder pain, she was evaluated in [facility] today. She had positive d-dimer, and was sent to ED for further evaluation to rule out PE. Her shoulder pain is aggravated by deep breath, therefore she can not take deep breath. She has very mild SOB. She does not have cough, chest pain, tenderness over the calf area. She reports that she had long distance traveling by airplane for 2 hours 2 weeks ago. Her maternal grandmother had blood clot. She is currently taking oral contraception. Patient does not have fever, chills, abdominal pain, diarrhea, symptoms of UTI, unilateral weakness.  In ED, patient was found to have left lower lobe PE by CT angiogram, without right heart straining. WBC 12.3, temperature normal, tachycardia, troponin negative, potassium 5.3 without EKG change. Patient is admitted to inpatient for further evaluation and treatment.   Hospital Course:   Pulmonary Embolus:  Presented with pleuritic chest pain which is aggravated by deep breath. Patient has had positive d-dimer is. CT angiography of the chest showed acute PE in the left lower lobe. No right heart strain per CT, no hypoxia or hypotension. Initially patient was started on  heparin drip, started on Xarelto on heparin drip discontinued. Patient discharged home in Xarelto to follow-up with primary care physician in one week.  Leukocytosis:  No signs of infection. Likely due to stress induced demargination.  Hyperkalemia:  Potassium 5.3 without EKG change. Repeat potassium level is 3.9.  Procedures:  None  Consultations:  None  Discharge Exam: Filed Vitals:   09/01/14 0544  BP: 116/65  Pulse: 88  Temp: 98.2 F (36.8 C)  Resp: 18   General: Alert and awake, oriented x3, not in any acute distress. HEENT: anicteric sclera, pupils reactive to light and accommodation, EOMI CVS: S1-S2 clear, no murmur rubs or gallops Chest: clear to auscultation bilaterally, no wheezing, rales or rhonchi Abdomen: soft nontender, nondistended, normal bowel sounds, no organomegaly Extremities: no cyanosis, clubbing or edema noted bilaterally Neuro: Cranial nerves II-XII intact, no focal neurological deficits  Discharge Instructions   Discharge Instructions    Diet - low sodium heart healthy    Complete by:  As directed      Increase activity slowly    Complete by:  As directed           Current Discharge Medication List    START taking these medications   Details  Rivaroxaban (XARELTO STARTER PACK) 15 & 20 MG TBPK Take as directed on package: Start with one 15mg  tablet by mouth twice a day with food. On Day 22, switch to one 20mg  tablet once a day with food. Qty: 51 each, Refills: 0      STOP taking these medications     TRI-PREVIFEM 0.18/0.215/0.25 MG-35 MCG tablet  No Known Allergies Follow-up Information    Follow up with Samantha RubensteinSUN,VYVYAN Y, MD In 1 week.   Specialty:  Family Medicine   Contact information:   [phone] [address] [place] [address] [phone]        The results of significant diagnostics from this hospitalization (including imaging, microbiology, ancillary and laboratory) are listed below for reference.     Significant Diagnostic Studies: Dg Chest 2 View  08/31/2014   CLINICAL DATA:  Shortness of breath, elevated D-dimer level.  EXAM: CHEST  2 VIEW  COMPARISON:  None.  FINDINGS: The heart size and mediastinal contours are within normal limits. No pneumothorax is noted. Right lung is clear. Mild left basilar opacity is noted concerning for pneumonia or subsegmental atelectasis. The visualized skeletal structures are unremarkable.  IMPRESSION: Mild left basilar opacity is noted concerning for pneumonia or subsegmental atelectasis. Follow-up radiographs are recommended to ensure resolution.   Electronically Signed   By: Lupita RaiderJames  Green Jr, M.D.   On: 08/31/2014 19:27   Ct Angio Chest Pe W/cm &/or Wo Cm  08/31/2014   CLINICAL DATA:  Shortness of breath. Left shoulder and upper back pain. Positive D-dimer.  EXAM: CT ANGIOGRAPHY CHEST WITH CONTRAST  TECHNIQUE: Multidetector CT imaging of the chest was performed using the standard protocol during bolus administration of intravenous contrast. Multiplanar CT image reconstructions and MIPs were obtained to evaluate the vascular anatomy.  CONTRAST:  100mL OMNIPAQUE IOHEXOL 350 MG/ML SOLN  COMPARISON:  Radiographs earlier this day.  FINDINGS: Examination is positive for pulmonary emboli. There are filling defects in the lobar branch to the left lower lobe, extending into the segmental and subsegmental branches laterally. No definite right-sided filling defects. There is no evidence right heart strain the RV to LV ratio is 0.84.  Ill-defined opacities in the left lower lobe, majority appears to represent atelectasis, however there is a peripheral density at the lateral basal segment that may reflect pulmonary infarct. There is a small left pleural effusion.  No mediastinal or hilar adenopathy. No pericardial effusion. No focal consolidation in the right lung, minimal atelectasis in the right lower lobe.  No acute abnormality in the included upper abdomen.  There are no acute  or suspicious osseous abnormalities. Minimal degenerative disc disease in the mid thoracic spine.  Review of the MIP images confirms the above findings.  IMPRESSION: Positive for pulmonary embolus in the left lower lobe. No evidence of right heart strain. Atelectasis and probable minimal pulmonary infarct in the left lower lobe.  Critical Value/emergent results were called by telephone at the time of interpretation on 08/31/2014 at 10:32 pm to Dr. Drema PryPEDRO CARDAMA , who verbally acknowledged these results.   Electronically Signed   By: Rubye OaksMelanie  Ehinger M.D.   On: 08/31/2014 22:34    Microbiology: No results found for this or any previous visit (from the past 240 hour(s)).   Labs: Basic Metabolic Panel:  Recent Labs Lab 08/31/14 1855 08/31/14 2358  NA 135  --   K 5.3* 3.9  CL 104  --   CO2 22  --   GLUCOSE 102*  --   BUN 8  --   CREATININE 0.84  --   CALCIUM 8.9  --    Liver Function Tests: No results for input(s): AST, ALT, ALKPHOS, BILITOT, PROT, ALBUMIN in the last 168 hours. No results for input(s): LIPASE, AMYLASE in the last 168 hours. No results for input(s): AMMONIA in the last 168 hours. CBC:  Recent Labs Lab  08/31/14 1855  WBC 12.3*  HGB 12.8  HCT 37.4  MCV 81.7  PLT 427*   Cardiac Enzymes: No results for input(s): CKTOTAL, CKMB, CKMBINDEX, TROPONINI in the last 168 hours. BNP: BNP (last 3 results) No results for input(s): BNP in the last 8760 hours.  ProBNP (last 3 results) No results for input(s): PROBNP in the last 8760 hours.  CBG:  Recent Labs Lab 09/01/14 0705  GLUCAP 102*       Signed:  Corneilus Heggie A  [facility] 09/01/2014, 11:08 AM

## 2014-09-02 LAB — FANA STAINING PATTERNS

## 2014-09-02 LAB — HOMOCYSTEINE: HOMOCYSTEINE-NORM: 12.9 umol/L (ref 0.0–15.0)

## 2014-09-02 LAB — ANTINUCLEAR ANTIBODIES, IFA

## 2014-09-03 LAB — CARDIOLIPIN ANTIBODIES, IGG, IGM, IGA

## 2014-09-03 LAB — PROTEIN C, TOTAL: Protein C, Total: 80 % (ref 70–140)

## 2014-09-03 LAB — LUPUS ANTICOAGULANT PANEL
DRVVT: 47.2 s (ref 0.0–55.1)
PTT Lupus Anticoagulant: 39.1 s (ref 0.0–50.0)

## 2014-09-03 LAB — PROTEIN C ACTIVITY: Protein C Activity: 105 % (ref 74–151)

## 2014-09-03 LAB — BETA-2-GLYCOPROTEIN I ABS, IGG/M/A
Beta-2 Glyco I IgG: <9 GPI IgG units (ref 0–20)
Beta-2-Glycoprotein I IgM: <9 GPI IgM units (ref 0–32)

## 2014-09-03 LAB — PROTEIN S, TOTAL: PROTEIN S AG TOTAL: 88 % (ref 58–150)

## 2014-09-03 LAB — PROTEIN S ACTIVITY: PROTEIN S ACTIVITY: 74 % (ref 60–145)

## 2014-09-04 LAB — PROTHROMBIN GENE MUTATION

## 2014-09-04 LAB — FACTOR 5 LEIDEN

## 2014-11-23 ENCOUNTER — Other Ambulatory Visit: Payer: Self-pay

## 2014-11-23 DIAGNOSIS — Z1231 Encounter for screening mammogram for malignant neoplasm of breast: Secondary | ICD-10-CM

## 2014-12-21 ENCOUNTER — Ambulatory Visit
Admission: RE | Admit: 2014-12-21 | Discharge: 2014-12-21 | Disposition: A | Discharge status: Home / Self Care | Payer: BLUE CROSS/BLUE SHIELD | Source: Ambulatory Visit

## 2014-12-21 DIAGNOSIS — Z1231 Encounter for screening mammogram for malignant neoplasm of breast: Secondary | ICD-10-CM

## 2015-12-03 ENCOUNTER — Other Ambulatory Visit: Payer: Self-pay | Admitting: Obstetrics and Gynecology

## 2015-12-03 DIAGNOSIS — Z1231 Encounter for screening mammogram for malignant neoplasm of breast: Secondary | ICD-10-CM

## 2015-12-07 ENCOUNTER — Ambulatory Visit
Admission: RE | Admit: 2015-12-07 | Discharge: 2015-12-07 | Disposition: A | Discharge status: Home / Self Care | Payer: BLUE CROSS/BLUE SHIELD | Source: Ambulatory Visit | Attending: Family Medicine | Admitting: Family Medicine

## 2015-12-07 ENCOUNTER — Other Ambulatory Visit: Payer: Self-pay | Admitting: Family Medicine

## 2015-12-07 DIAGNOSIS — R1011 Right upper quadrant pain: Secondary | ICD-10-CM

## 2015-12-22 ENCOUNTER — Ambulatory Visit: Payer: BLUE CROSS/BLUE SHIELD

## 2015-12-23 ENCOUNTER — Ambulatory Visit
Admission: RE | Admit: 2015-12-23 | Discharge: 2015-12-23 | Disposition: A | Discharge status: Home / Self Care | Payer: BLUE CROSS/BLUE SHIELD | Source: Ambulatory Visit | Attending: Obstetrics and Gynecology | Admitting: Obstetrics and Gynecology

## 2015-12-23 DIAGNOSIS — Z1231 Encounter for screening mammogram for malignant neoplasm of breast: Secondary | ICD-10-CM

## 2015-12-24 ENCOUNTER — Ambulatory Visit: Payer: Self-pay | Admitting: Surgery

## 2015-12-24 NOTE — H&P (Signed)
Samantha Rollins 12/24/2015 3:26 PM Location: [facility] Patient #: [id number] DOB: 1971-02-16 Married / Language: [place] / Race: Black or African American Female  History of Present Illness (Jaimee Corum A. Fredricka Bonineonnor MD; 12/24/2015 3:46 PM) Patient words: This is a very pleasant 45 year old woman who has really no other medical problems, but has been having intermittent bouts of right upper quadrant pain and nausea related mostly to fatty meals for the last 2-3 months. She's not noticed any fevers, no vomiting, no changes in her bowel movements. She does have a history of reflux but really has not had any issues with this recently and is not taking any medications currently. She denies any chest pain, shortness of breath, cough, back pain, diarrhea. She underwent ultrasound which revealed a small amount of layering sludge in the gallbladder and common bile duct with a diameter of 3 mm.   Other Problems Doristine Devoid(Chemira Jones, CMA; 12/24/2015 3:26 PM) Pulmonary Embolism / Blood Clot in Legs  Past Surgical History Doristine Devoid(Chemira Jones, CMA; 12/24/2015 3:26 PM) Cesarean Section - 1 Foot Surgery Bilateral. Oral Surgery  Diagnostic Studies History Doristine Devoid(Chemira Jones, CMA; 12/24/2015 3:26 PM) Colonoscopy never Mammogram within last year Pap Smear 1-5 years ago  Allergies Doristine Devoid(Chemira Jones, CMA; 12/24/2015 3:27 PM) No Known Drug Allergies 12/24/2015  Medication History Doristine Devoid(Chemira Jones, CMA; 12/24/2015 3:27 PM) No Current Medications Medications Reconciled  Social History Doristine Devoid(Chemira Jones, CMA; 12/24/2015 3:26 PM) Alcohol use Remotely quit alcohol use. Caffeine use Coffee, Tea. No drug use Tobacco use Never smoker. She works at [employer].   Family History Doristine Devoid(Chemira Jones, CMA; 12/24/2015 3:26 PM) Arthritis Family Members In General, Mother. Heart Disease Family Members In General. Heart disease in female family member before age 865 Hypertension Mother. Migraine Headache  Mother.  Pregnancy / Birth History Doristine Devoid(Chemira Jones, CMA; 12/24/2015 3:26 PM) Age at menarche 13 years. Gravida 1 Irregular periods Maternal age 43-35 Para 1     Review of Systems Doristine Devoid(Chemira Jones CMA; 12/24/2015 3:26 PM) General Not Present- Appetite Loss, Chills, Fatigue, Fever, Night Sweats, Weight Gain and Weight Loss. Skin Not Present- Change in Wart/Mole, Dryness, Hives, Jaundice, New Lesions, Non-Healing Wounds, Rash and Ulcer. HEENT Not Present- Earache, Hearing Loss, Hoarseness, Nose Bleed, Oral Ulcers, Ringing in the Ears, Seasonal Allergies, Sinus Pain, Sore Throat, Visual Disturbances, Wears glasses/contact lenses and Yellow Eyes. Respiratory Not Present- Bloody sputum, Chronic Cough, Difficulty Breathing, Snoring and Wheezing. Breast Not Present- Breast Mass, Breast Pain, Nipple Discharge and Skin Changes. Cardiovascular Not Present- Chest Pain, Difficulty Breathing Lying Down, Leg Cramps, Palpitations, Rapid Heart Rate, Shortness of Breath and Swelling of Extremities. Gastrointestinal Present- Abdominal Pain and Nausea. Not Present- Bloating, Bloody Stool, Change in Bowel Habits, Chronic diarrhea, Constipation, Difficulty Swallowing, Excessive gas, Gets full quickly at meals, Hemorrhoids, Indigestion, Rectal Pain and Vomiting. Female Genitourinary Not Present- Frequency, Nocturia, Painful Urination, Pelvic Pain and Urgency. Musculoskeletal Not Present- Back Pain, Joint Pain, Joint Stiffness, Muscle Pain, Muscle Weakness and Swelling of Extremities. Neurological Not Present- Decreased Memory, Fainting, Headaches, Numbness, Seizures, Tingling, Tremor, Trouble walking and Weakness. Psychiatric Not Present- Anxiety, Bipolar, Change in Sleep Pattern, Depression, Fearful and Frequent crying. Endocrine Not Present- Cold Intolerance, Excessive Hunger, Hair Changes, Heat Intolerance, Hot flashes and New Diabetes. Hematology Not Present- Blood Thinners, Easy Bruising, Excessive  bleeding, Gland problems, HIV and Persistent Infections.  Vitals (Chemira Jones CMA; 12/24/2015 3:27 PM) 12/24/2015 3:26 PM Weight: 229.2 lb Height: 65in Body Surface Area: 2.1 m Body Mass Index: 38.14 kg/m  Temp.: 98.14F(Oral)  Pulse:  82 (Regular)  BP: 126/82 (Sitting, Left Arm, Standard)      Physical Exam (Sanjna Haskew A. Liona Wengert MD; 12/24/2015 3:48 PM)  The physical exam findings are as follows: Note:AOx3, no distress EOMI, PERRL, Anicteric; MMM Unlabored/CTAB, RRR Abdomen soft, obese, nontender. healed pfannenenstiel form remote c-section Ext WWP Neuro grossly intact Psych normal mood and affect    Assessment & Plan (Amadi Yoshino A. Fredricka Bonine MD; 12/24/2015 3:50 PM)  BILIARY COLIC (Principal Diagnosis) (K80.50) Story: Will plan for laparoscopic cholecystectomy. We discussed the surgery, the risks and benefits including risk of bleeding, infection, pain, scarring, injury to nearby structures specifically the common bile duct. She expressed understanding. We discussed the alternative of staying on a low-fat diet and no surgery. She wishes to proceed with surgery.

## 2016-11-22 ENCOUNTER — Other Ambulatory Visit: Payer: Self-pay | Admitting: Obstetrics and Gynecology

## 2016-11-22 DIAGNOSIS — Z1231 Encounter for screening mammogram for malignant neoplasm of breast: Secondary | ICD-10-CM

## 2016-12-25 ENCOUNTER — Ambulatory Visit
Admission: RE | Admit: 2016-12-25 | Discharge: 2016-12-25 | Disposition: A | Discharge status: Home / Self Care | Payer: BLUE CROSS/BLUE SHIELD | Source: Ambulatory Visit | Attending: Obstetrics and Gynecology | Admitting: Obstetrics and Gynecology

## 2016-12-25 DIAGNOSIS — Z1231 Encounter for screening mammogram for malignant neoplasm of breast: Secondary | ICD-10-CM

## 2017-11-28 ENCOUNTER — Other Ambulatory Visit: Payer: Self-pay | Admitting: Obstetrics and Gynecology

## 2017-11-28 DIAGNOSIS — Z1231 Encounter for screening mammogram for malignant neoplasm of breast: Secondary | ICD-10-CM

## 2017-12-27 ENCOUNTER — Ambulatory Visit
Admission: RE | Admit: 2017-12-27 | Discharge: 2017-12-27 | Disposition: A | Discharge status: Home / Self Care | Payer: BLUE CROSS/BLUE SHIELD | Source: Ambulatory Visit | Attending: Obstetrics and Gynecology | Admitting: Obstetrics and Gynecology

## 2017-12-27 DIAGNOSIS — Z1231 Encounter for screening mammogram for malignant neoplasm of breast: Secondary | ICD-10-CM

## 2018-12-03 ENCOUNTER — Other Ambulatory Visit: Payer: Self-pay | Admitting: Obstetrics and Gynecology

## 2018-12-03 DIAGNOSIS — Z1231 Encounter for screening mammogram for malignant neoplasm of breast: Secondary | ICD-10-CM

## 2019-01-17 ENCOUNTER — Ambulatory Visit
Admission: RE | Admit: 2019-01-17 | Discharge: 2019-01-17 | Disposition: A | Discharge status: Home / Self Care | Payer: BC Managed Care – PPO | Source: Ambulatory Visit | Attending: Obstetrics and Gynecology | Admitting: Obstetrics and Gynecology

## 2019-01-17 ENCOUNTER — Other Ambulatory Visit: Payer: Self-pay

## 2019-01-17 DIAGNOSIS — Z1231 Encounter for screening mammogram for malignant neoplasm of breast: Secondary | ICD-10-CM

## 2019-12-17 ENCOUNTER — Other Ambulatory Visit: Payer: Self-pay | Admitting: Obstetrics and Gynecology

## 2019-12-17 DIAGNOSIS — Z1231 Encounter for screening mammogram for malignant neoplasm of breast: Secondary | ICD-10-CM

## 2020-01-19 ENCOUNTER — Other Ambulatory Visit: Payer: Self-pay

## 2020-01-19 ENCOUNTER — Ambulatory Visit
Admission: RE | Admit: 2020-01-19 | Discharge: 2020-01-19 | Disposition: A | Discharge status: Home / Self Care | Payer: No Typology Code available for payment source | Source: Ambulatory Visit | Attending: Obstetrics and Gynecology | Admitting: Obstetrics and Gynecology

## 2020-01-19 DIAGNOSIS — Z1231 Encounter for screening mammogram for malignant neoplasm of breast: Secondary | ICD-10-CM

## 2020-12-30 ENCOUNTER — Other Ambulatory Visit: Payer: Self-pay | Admitting: Obstetrics and Gynecology

## 2020-12-30 DIAGNOSIS — Z1231 Encounter for screening mammogram for malignant neoplasm of breast: Secondary | ICD-10-CM

## 2021-02-02 ENCOUNTER — Ambulatory Visit
Admission: RE | Admit: 2021-02-02 | Discharge: 2021-02-02 | Disposition: A | Discharge status: Home / Self Care | Payer: No Typology Code available for payment source | Source: Ambulatory Visit | Attending: Obstetrics and Gynecology | Admitting: Obstetrics and Gynecology

## 2021-02-02 ENCOUNTER — Other Ambulatory Visit: Payer: Self-pay

## 2021-02-02 DIAGNOSIS — Z1231 Encounter for screening mammogram for malignant neoplasm of breast: Secondary | ICD-10-CM

## 2022-01-16 ENCOUNTER — Other Ambulatory Visit: Payer: Self-pay | Admitting: Obstetrics and Gynecology

## 2022-01-16 DIAGNOSIS — Z1231 Encounter for screening mammogram for malignant neoplasm of breast: Secondary | ICD-10-CM

## 2022-02-08 ENCOUNTER — Ambulatory Visit
Admission: RE | Admit: 2022-02-08 | Discharge: 2022-02-08 | Disposition: A | Discharge status: Home / Self Care | Payer: No Typology Code available for payment source | Source: Ambulatory Visit | Attending: Obstetrics and Gynecology | Admitting: Obstetrics and Gynecology

## 2022-02-08 DIAGNOSIS — Z1231 Encounter for screening mammogram for malignant neoplasm of breast: Secondary | ICD-10-CM

## 2022-03-03 ENCOUNTER — Ambulatory Visit: Payer: No Typology Code available for payment source | Admitting: Podiatry

## 2022-04-14 ENCOUNTER — Encounter: Payer: Self-pay | Admitting: Physician Assistant

## 2022-04-14 DIAGNOSIS — R1011 Right upper quadrant pain: Secondary | ICD-10-CM

## 2022-04-20 ENCOUNTER — Other Ambulatory Visit: Payer: Self-pay

## 2022-04-20 ENCOUNTER — Emergency Department (HOSPITAL_COMMUNITY): Payer: No Typology Code available for payment source

## 2022-04-20 ENCOUNTER — Emergency Department (HOSPITAL_COMMUNITY)
Admission: EM | Admit: 2022-04-20 | Discharge: 2022-04-20 | Disposition: A | Discharge status: Home / Self Care | Payer: No Typology Code available for payment source | Attending: Emergency Medicine | Admitting: Emergency Medicine

## 2022-04-20 DIAGNOSIS — R1011 Right upper quadrant pain: Secondary | ICD-10-CM | POA: Diagnosis present

## 2022-04-20 DIAGNOSIS — K8020 Calculus of gallbladder without cholecystitis without obstruction: Secondary | ICD-10-CM | POA: Insufficient documentation

## 2022-04-20 DIAGNOSIS — Z7901 Long term (current) use of anticoagulants: Secondary | ICD-10-CM | POA: Diagnosis not present

## 2022-04-20 LAB — COMPREHENSIVE METABOLIC PANEL
ALT: 35 U/L (ref 0–44)
AST: 33 U/L (ref 15–41)
Albumin: 3.9 g/dL (ref 3.5–5.0)
Alkaline Phosphatase: 73 U/L (ref 38–126)
Anion gap: 13 (ref 5–15)
BUN: 9 mg/dL (ref 6–20)
CO2: 24 mmol/L (ref 22–32)
Calcium: 9.2 mg/dL (ref 8.9–10.3)
Chloride: 105 mmol/L (ref 98–111)
Creatinine, Ser: 0.98 mg/dL (ref 0.44–1.00)
GFR, Estimated: >60 mL/min (ref >60)
Glucose, Bld: 103 mg/dL — ABNORMAL HIGH (ref 70–99)
Potassium: 4.2 mmol/L (ref 3.5–5.1)
Sodium: 142 mmol/L (ref 135–145)
Total Bilirubin: 0.9 mg/dL (ref 0.3–1.2)
Total Protein: 7.7 g/dL (ref 6.5–8.1)

## 2022-04-20 LAB — CBC WITH DIFFERENTIAL/PLATELET
Abs Immature Granulocytes: 0.04 10*3/uL (ref 0.00–0.07)
Basophils Absolute: 0.1 10*3/uL (ref 0.0–0.1)
Basophils Relative: 1 %
Eosinophils Absolute: 0.1 10*3/uL (ref 0.0–0.5)
Eosinophils Relative: 1 %
HCT: 40.8 % (ref 36.0–46.0)
Hemoglobin: 13.3 g/dL (ref 12.0–15.0)
Immature Granulocytes: 1 %
Lymphocytes Relative: 24 %
Lymphs Abs: 1.9 10*3/uL (ref 0.7–4.0)
MCH: 27.9 pg (ref 26.0–34.0)
MCHC: 32.6 g/dL (ref 30.0–36.0)
MCV: 85.7 fL (ref 80.0–100.0)
Monocytes Absolute: 0.7 10*3/uL (ref 0.1–1.0)
Monocytes Relative: 9 %
Neutro Abs: 5.1 10*3/uL (ref 1.7–7.7)
Neutrophils Relative %: 64 %
Platelets: 354 10*3/uL (ref 150–400)
RBC: 4.76 MIL/uL (ref 3.87–5.11)
RDW: 12.3 % (ref 11.5–15.5)
WBC: 7.9 10*3/uL (ref 4.0–10.5)
nRBC: 0 % (ref 0.0–0.2)

## 2022-04-20 LAB — LIPASE, BLOOD: Lipase: 62 U/L — ABNORMAL HIGH (ref 11–51)

## 2022-04-20 MED ORDER — OXYCODONE HCL 5 MG PO TABS: 5.0000 mg | ORAL_TABLET | ORAL | 0 refills | Status: DC | PRN

## 2022-04-20 MED ORDER — MORPHINE SULFATE (PF) 4 MG/ML IV SOLN
4.0000 mg | Freq: Once | INTRAVENOUS | Status: AC
  Administered 2022-04-20: 4 mg via INTRAVENOUS
  Filled 2022-04-20: qty 1

## 2022-04-20 MED ORDER — HYDROMORPHONE HCL 1 MG/ML IJ SOLN
1.0000 mg | Freq: Once | INTRAMUSCULAR | Status: AC
  Administered 2022-04-20: 1 mg via INTRAVENOUS
  Filled 2022-04-20: qty 1

## 2022-04-20 MED ORDER — SODIUM CHLORIDE 0.9 % IV BOLUS
1000.0000 mL | Freq: Once | INTRAVENOUS | Status: AC
  Administered 2022-04-20: 1000 mL via INTRAVENOUS

## 2022-04-20 MED ORDER — ONDANSETRON HCL 4 MG/2ML IJ SOLN
4.0000 mg | Freq: Once | INTRAMUSCULAR | Status: AC
  Administered 2022-04-20: 4 mg via INTRAVENOUS
  Filled 2022-04-20: qty 2

## 2022-04-20 NOTE — ED Notes (Signed)
Pt unable to give urine sample

## 2022-04-20 NOTE — ED Provider Triage Note (Signed)
Emergency Medicine Provider Triage Evaluation Note  Samantha Rollins , a 52 y.o. female  was evaluated in triage.  Pt complains of RUQ pain that significantly worsened last night. The patient reports that she has been having RUQ pain off and on over the past 2 weeks. She had an [place] last week that showed gallstones and she has been sent to a Education officer, environmental. She reports that she started to have worsening pain and vomiting. No diarrhea, fever, or dark stools.   Review of Systems  Positive:  Negative:   Physical Exam  There were no vitals taken for this visit. Gen:   Awake, no distress   Resp:  Normal effort  MSK:   Moves extremities without difficulty  Other:  RUQ tenderness to palpation. No guarding or rebound  Medical Decision Making  Medically screening exam initiated at 3:19 PM.  Appropriate orders placed.  Samantha Rollins was informed that the remainder of the evaluation will be completed by another provider, this initial triage assessment does not replace that evaluation, and the importance of remaining in the ED until their evaluation is complete.  Repeat US to r/o stones in the neck. Labs ordered.   Sherrell Puller, PA-C 04/20/22 1521

## 2022-04-20 NOTE — ED Provider Notes (Signed)
[place] EMERGENCY DEPARTMENT AT [facility] Provider Note   CSN: [id number] Arrival date & time: 04/20/22  1458     History  Chief Complaint  Patient presents with   Abdominal Pain    Samantha Rollins is a 52 y.o. female.  Patient p/f complaint of right upper quadrant abdominal pain that began one week ago. Patient reports that she has had worsening right upper quadrant pain that started last night.  It was intermittent originally, not related to eating.  However last night it became constant, is a 9/10 pain and has been nauseous with vomiting.  Patient denies diarrhea, last bowel movement yesterday and regular.  Patient denies fever or dark stools, denies hematemesis.  Patient denies fever or chills as well.  She has seen general surgery in the past and they recommended dietary changes for this pain.        Home Medications Prior to Admission medications   Medication Sig Start Date End Date Taking? Authorizing Provider  oxyCODONE (ROXICODONE) 5 MG immediate release tablet Take 1 tablet (5 mg total) by mouth every 4 (four) hours as needed for up to 15 doses for severe pain. 04/20/22   Erskine Emery, MD  Rivaroxaban (XARELTO STARTER PACK) 15 & 20 MG TBPK Take as directed on package: Start with one 29m tablet by mouth twice a day with food. On Day 22, switch to one 245mtablet once a day with food. 09/01/14   ElVerlee MonteMD      Allergies    Patient has no known allergies.    Review of Systems   Review of Systems  Constitutional:  Negative for chills and fever.  HENT:  Negative for ear pain and sore throat.   Eyes:  Negative for pain and visual disturbance.  Respiratory:  Negative for cough and shortness of breath.   Cardiovascular:  Negative for chest pain and palpitations.  Gastrointestinal:  Positive for abdominal pain (Right upper quadrant), nausea and vomiting. Negative for blood in stool, constipation and diarrhea.  Genitourinary:  Negative for dysuria  and hematuria.  Musculoskeletal:  Negative for arthralgias and back pain.  Skin:  Negative for color change and rash.  Neurological:  Negative for seizures and syncope.  All other systems reviewed and are negative.   Physical Exam Updated Vital Signs BP (!) 161/78 (BP Location: Right Arm)   Pulse 84   Temp 99.1 F (37.3 C) (Oral)   Resp 20   Wt 103 kg   SpO2 97%   BMI 37.79 kg/m  Physical Exam Vitals and nursing note reviewed.  Constitutional:      General: She is not in acute distress.    Appearance: She is well-developed.  HENT:     Head: Normocephalic and atraumatic.  Eyes:     Conjunctiva/sclera: Conjunctivae normal.  Cardiovascular:     Rate and Rhythm: Normal rate and regular rhythm.     Heart sounds: No murmur heard. Pulmonary:     Effort: Pulmonary effort is normal. No respiratory distress.     Breath sounds: Normal breath sounds.  Abdominal:     Palpations: Abdomen is soft.     Tenderness: There is abdominal tenderness in the right upper quadrant. There is guarding. Positive signs include Murphy's sign.  Musculoskeletal:        General: No swelling.     Cervical back: Neck supple.  Skin:    General: Skin is warm and dry.     Capillary Refill: Capillary refill takes  less than 2 seconds.  Neurological:     Mental Status: She is alert.  Psychiatric:        Mood and Affect: Mood normal.    ED Results / Procedures / Treatments   Labs (all labs ordered are listed, but only abnormal results are displayed) Labs Reviewed  LIPASE, BLOOD - Abnormal; Notable for the following components:      Result Value   Lipase 62 (*)    All other components within normal limits  COMPREHENSIVE METABOLIC PANEL - Abnormal; Notable for the following components:   Glucose, Bld 103 (*)    All other components within normal limits  CBC WITH DIFFERENTIAL/PLATELET  URINALYSIS, W/ REFLEX TO CULTURE (INFECTION SUSPECTED)    EKG None  Radiology US Abdomen Limited RUQ  (LIVER/GB)  Result Date: 04/20/2022 CLINICAL DATA:  Acute right upper quadrant abdominal pain EXAM: ULTRASOUND ABDOMEN LIMITED RIGHT UPPER QUADRANT COMPARISON:  December 07, 2015. FINDINGS: Gallbladder: Cholelithiasis without gallbladder wall thickening or pericholecystic fluid. No sonographic Murphy's sign. Common bile duct: Diameter: 3 mm which is within normal limits. Liver: No focal lesion identified. Within normal limits in parenchymal echogenicity. Portal vein is patent on color Doppler imaging with normal direction of blood flow towards the liver. Other: None. IMPRESSION: Mild cholelithiasis without cholecystitis. No other abnormality seen in the right upper quadrant of the abdomen. Electronically Signed   By: Marijo Conception M.D.   On: 04/20/2022 15:44    Procedures Procedures    Medications Ordered in ED Medications  morphine (PF) 4 MG/ML injection 4 mg (4 mg Intravenous Given 04/20/22 1749)  sodium chloride 0.9 % bolus 1,000 mL (0 mLs Intravenous Stopped 04/20/22 2014)  ondansetron (ZOFRAN) injection 4 mg (4 mg Intravenous Given 04/20/22 1748)  HYDROmorphone (DILAUDID) injection 1 mg (1 mg Intravenous Given 04/20/22 1911)    ED Course/ Medical Decision Making/ A&P                             Medical Decision Making Medical Decision Making:   Samantha Rollins is a 52 y.o. female who presented to the ED today with RUQ abdominal pain detailed above.    External chart has been reviewed including previous surgery notes.  Complete initial physical exam performed, notably the patient was experiencing RUQ tenderness without acute abdomen.    Reviewed and confirmed nursing documentation for past medical history, family history, social history.    Initial Assessment:   With the patient's presentation of abdominal pain, most likely diagnosis is symptomatic cholelithiasis. Other diagnoses were considered including (but not limited to) pancreatitis (lipase 62), choledocholithiasis/cholangitis, RLL  PNA, functional dyspepsia, shock liver, pyelonephritis, urinary calculi. These are considered less likely due to history of present illness and physical exam findings.   This is most consistent with an acute complicated illness  Initial Plan:   Screening labs including CBC and Metabolic panel with lipase  Urinalysis ordered to evaluate for UTI or relevant urologic/nephrologic pathology.  RUQ U/S for biliary colic ordered  Continue normal saline bolus 1 L, morphine for pain control.  Reassessment after morphine, patient still had severe abdominal pain. Gave her a dose of Dilaudid for pain control.   Objective evaluation as below reviewed   Initial Study Results:   Laboratory  All laboratory results reviewed without evidence of clinically relevant pathology.   Exceptions include: slight elevation in lipase     Radiology:  All images reviewed independently. Agree with radiology  report at this time.   US Abdomen Limited RUQ (LIVER/GB)  Result Date: 04/20/2022 Mild cholelithiasis without cholecystitis. No other abnormality seen in the right upper quadrant of the abdomen.   Final Assessment and Plan:   Patient presenting with symptomatic cholelithiasis based on U/S presentation.  Patient is hemodynamically stable, and pain was controlled with Dilaudid after morphine.  We discussed the options of outpatient pain control and outpatient surgery referral.  With shared decision-making, the patient decided to continue with pain control outpatient.  Provided patient with 15 tabs of oxycodone 5 mg to take as needed.  Instructed to remain hydrated as able.  Avoid fatty foods.  Patient was previously referred to [facility] surgery, provided their phone number for the patient to make an appointment.  Strict return precautions provided and patient verbalized understanding.   Clinical Impression: Symptomatic cholelithiasis    Risk Prescription drug management.   Initially printed Rx for  oxycodone, shredded this and sent oxycodone to 24-hour pharmacy of their choice.        Final Clinical Impression(s) / ED Diagnoses Final diagnoses:  Symptomatic cholelithiasis    Rx / DC Orders ED Discharge Orders          Ordered    oxyCODONE (ROXICODONE) 5 MG immediate release tablet  Every 4 hours PRN,   Status:  Discontinued        04/20/22 2006    oxyCODONE (ROXICODONE) 5 MG immediate release tablet  Every 4 hours PRN        04/20/22 2007              Erskine Emery, MD 04/20/22 2027    Drenda Freeze, MD 04/21/22 2300

## 2022-04-20 NOTE — Discharge Instructions (Addendum)
Call [facility] tomorrow to schedule urgent appt  [phone]  I have given oxycodone to take every 4 hours as needed in between tylenol   Please return if you cannot control the pain at home, have fevers, chills, worsening vomiting

## 2022-04-20 NOTE — ED Triage Notes (Signed)
C/o intermittent upper abd pain with n/v that started last night.  Pt reports being referred to general surgery for gallstones.

## 2022-04-20 NOTE — ED Notes (Signed)
Discharge instructions provided by EDP were reinforced with pt and husband at bedside. Pt was able to verbalize understanding with no additional questions at this time. Pharmacy verified with pt. Pt to go home with s/o at bedside.

## 2022-04-28 ENCOUNTER — Other Ambulatory Visit: Payer: Self-pay

## 2022-04-28 ENCOUNTER — Encounter (HOSPITAL_COMMUNITY): Payer: Self-pay | Admitting: Surgery

## 2022-04-28 NOTE — Progress Notes (Signed)
For Anesthesia: PCP - Antony Contras, MD  Cardiologist - N/A  Chest x-ray - greater than 1 year  EKG - greater than 1 year  Stress Test - N/A ECHO - N/A Cardiac Cath - N/A Pacemaker/ICD device last checked:N/A Pacemaker orders received: N/A Device Rep notified: N/A  Spinal Cord Stimulator: N/A  Sleep Study - N/A CPAP - N/A  Fasting Blood Sugar - N/A Checks Blood Sugar __N/A___ times a day Date and result of last Hgb A1c-N/A  Last dose of GLP1 agonist- N/A GLP1 instructions: N/A  Last dose of SGLT-2 inhibitors- N/A SGLT-2 instructions:N/A  Blood Thinner Instructions:N/A Aspirin Instructions:N/A Last Dose:N/A  Activity level: Can go up a flight of stairs and activities of daily living without stopping and without chest pain and/or shortness of breath  Anesthesia review: Janett Billow made aware of PE history 2016 after taking birth control pills, no further issues  Patient denies shortness of breath, fever, cough and chest pain at PAT appointment   Patient verbalized understanding of instructions reviewed via telephone.

## 2022-04-28 NOTE — Progress Notes (Signed)
Pt aware to arrive at [facility] admitting at 1015 on Monday 05/01/2022 for scheduled surgical procedure. No food after midnight; clear liquids from midnight till 10 am then nothing by mouth.

## 2022-05-01 ENCOUNTER — Encounter (HOSPITAL_COMMUNITY)
Admission: RE | Disposition: A | Discharge status: Home / Self Care | Payer: Self-pay | Source: Ambulatory Visit | Attending: Surgery

## 2022-05-01 ENCOUNTER — Other Ambulatory Visit: Payer: Self-pay

## 2022-05-01 ENCOUNTER — Ambulatory Visit (HOSPITAL_COMMUNITY): Payer: No Typology Code available for payment source | Admitting: Anesthesiology

## 2022-05-01 ENCOUNTER — Ambulatory Visit (HOSPITAL_BASED_OUTPATIENT_CLINIC_OR_DEPARTMENT_OTHER): Payer: No Typology Code available for payment source | Admitting: Anesthesiology

## 2022-05-01 ENCOUNTER — Encounter (HOSPITAL_COMMUNITY): Payer: Self-pay | Admitting: Surgery

## 2022-05-01 ENCOUNTER — Ambulatory Visit (HOSPITAL_COMMUNITY)
Admission: RE | Admit: 2022-05-01 | Discharge: 2022-05-01 | Disposition: A | Discharge status: Home / Self Care | Payer: No Typology Code available for payment source | Source: Ambulatory Visit | Attending: Surgery | Admitting: Surgery

## 2022-05-01 ENCOUNTER — Ambulatory Visit: Payer: Self-pay | Admitting: Surgery

## 2022-05-01 DIAGNOSIS — Z6838 Body mass index (BMI) 38.0-38.9, adult: Secondary | ICD-10-CM | POA: Insufficient documentation

## 2022-05-01 DIAGNOSIS — K8012 Calculus of gallbladder with acute and chronic cholecystitis without obstruction: Secondary | ICD-10-CM | POA: Diagnosis not present

## 2022-05-01 DIAGNOSIS — K819 Cholecystitis, unspecified: Secondary | ICD-10-CM

## 2022-05-01 DIAGNOSIS — K219 Gastro-esophageal reflux disease without esophagitis: Secondary | ICD-10-CM | POA: Diagnosis not present

## 2022-05-01 DIAGNOSIS — K8010 Calculus of gallbladder with chronic cholecystitis without obstruction: Secondary | ICD-10-CM | POA: Diagnosis present

## 2022-05-01 HISTORY — DX: Gastro-esophageal reflux disease without esophagitis: K21.9

## 2022-05-01 HISTORY — DX: Personal history of other diseases of the musculoskeletal system and connective tissue: Z87.39

## 2022-05-01 HISTORY — DX: Calculus of gallbladder without cholecystitis without obstruction: K80.20

## 2022-05-01 HISTORY — PX: CHOLECYSTECTOMY: SHX55

## 2022-05-01 SURGERY — LAPAROSCOPIC CHOLECYSTECTOMY
Anesthesia: General

## 2022-05-01 MED ORDER — CHLORHEXIDINE GLUCONATE CLOTH 2 % EX PADS: 6.0000 | MEDICATED_PAD | Freq: Once | CUTANEOUS | Status: DC

## 2022-05-01 MED ORDER — SUGAMMADEX SODIUM 500 MG/5ML IV SOLN
INTRAVENOUS | Status: AC
  Filled 2022-05-01: qty 5

## 2022-05-01 MED ORDER — DEXAMETHASONE SODIUM PHOSPHATE 10 MG/ML IJ SOLN
INTRAMUSCULAR | Status: AC
  Filled 2022-05-01: qty 1

## 2022-05-01 MED ORDER — LACTATED RINGERS IV SOLN: INTRAVENOUS | Status: DC

## 2022-05-01 MED ORDER — MIDAZOLAM HCL 2 MG/2ML IJ SOLN
INTRAMUSCULAR | Status: DC | PRN
  Administered 2022-05-01: 2 mg via INTRAVENOUS

## 2022-05-01 MED ORDER — ACETAMINOPHEN 500 MG PO TABS
1000.0000 mg | ORAL_TABLET | ORAL | Status: AC
  Administered 2022-05-01: 1000 mg via ORAL
  Filled 2022-05-01: qty 2

## 2022-05-01 MED ORDER — PROPOFOL 10 MG/ML IV BOLUS
INTRAVENOUS | Status: AC
  Filled 2022-05-01: qty 20

## 2022-05-01 MED ORDER — MIDAZOLAM HCL 2 MG/2ML IJ SOLN
INTRAMUSCULAR | Status: AC
  Filled 2022-05-01: qty 2

## 2022-05-01 MED ORDER — CHLORHEXIDINE GLUCONATE 0.12 % MT SOLN
15.0000 mL | Freq: Once | OROMUCOSAL | Status: AC
  Administered 2022-05-01: 15 mL via OROMUCOSAL

## 2022-05-01 MED ORDER — FENTANYL CITRATE (PF) 250 MCG/5ML IJ SOLN
INTRAMUSCULAR | Status: DC | PRN
  Administered 2022-05-01 (×3): 100 ug via INTRAVENOUS
  Administered 2022-05-01: 50 ug via INTRAVENOUS

## 2022-05-01 MED ORDER — ONDANSETRON HCL 4 MG/2ML IJ SOLN
INTRAMUSCULAR | Status: DC | PRN
  Administered 2022-05-01: 4 mg via INTRAVENOUS

## 2022-05-01 MED ORDER — PROPOFOL 10 MG/ML IV BOLUS
INTRAVENOUS | Status: DC | PRN
  Administered 2022-05-01: 150 mg via INTRAVENOUS

## 2022-05-01 MED ORDER — HYDROMORPHONE HCL 1 MG/ML IJ SOLN
INTRAMUSCULAR | Status: AC
  Administered 2022-05-01: 0.5 mg via INTRAVENOUS
  Filled 2022-05-01: qty 1

## 2022-05-01 MED ORDER — ONDANSETRON HCL 4 MG/2ML IJ SOLN
INTRAMUSCULAR | Status: AC
  Filled 2022-05-01: qty 2

## 2022-05-01 MED ORDER — DEXAMETHASONE SODIUM PHOSPHATE 10 MG/ML IJ SOLN
INTRAMUSCULAR | Status: DC | PRN
  Administered 2022-05-01: 5 mg via INTRAVENOUS

## 2022-05-01 MED ORDER — HYDROMORPHONE HCL 1 MG/ML IJ SOLN
0.2500 mg | INTRAMUSCULAR | Status: DC | PRN
  Administered 2022-05-01 (×2): 0.5 mg via INTRAVENOUS

## 2022-05-01 MED ORDER — SUGAMMADEX SODIUM 500 MG/5ML IV SOLN
INTRAVENOUS | Status: DC | PRN
  Administered 2022-05-01: 500 mg via INTRAVENOUS

## 2022-05-01 MED ORDER — BUPIVACAINE LIPOSOME 1.3 % IJ SUSP: 20.0000 mL | Freq: Once | INTRAMUSCULAR | Status: DC

## 2022-05-01 MED ORDER — ORAL CARE MOUTH RINSE: 15.0000 mL | Freq: Once | OROMUCOSAL | Status: AC

## 2022-05-01 MED ORDER — ROCURONIUM BROMIDE 10 MG/ML (PF) SYRINGE
PREFILLED_SYRINGE | INTRAVENOUS | Status: AC
  Filled 2022-05-01: qty 10

## 2022-05-01 MED ORDER — 0.9 % SODIUM CHLORIDE (POUR BTL) OPTIME
TOPICAL | Status: DC | PRN
  Administered 2022-05-01: 1000 mL

## 2022-05-01 MED ORDER — BUPIVACAINE-EPINEPHRINE 0.25% -1:200000 IJ SOLN
INTRAMUSCULAR | Status: DC | PRN
  Administered 2022-05-01: 30 mL

## 2022-05-01 MED ORDER — FENTANYL CITRATE (PF) 250 MCG/5ML IJ SOLN
INTRAMUSCULAR | Status: AC
  Filled 2022-05-01: qty 5

## 2022-05-01 MED ORDER — OXYCODONE HCL 5 MG PO TABS
5.0000 mg | ORAL_TABLET | Freq: Once | ORAL | Status: AC
  Administered 2022-05-01: 5 mg via ORAL

## 2022-05-01 MED ORDER — OXYCODONE-ACETAMINOPHEN 5-325 MG PO TABS: 1.0000 | ORAL_TABLET | ORAL | 0 refills | Status: AC | PRN

## 2022-05-01 MED ORDER — BUPIVACAINE-EPINEPHRINE (PF) 0.25% -1:200000 IJ SOLN
INTRAMUSCULAR | Status: AC
  Filled 2022-05-01: qty 30

## 2022-05-01 MED ORDER — LIDOCAINE 2% (20 MG/ML) 5 ML SYRINGE
INTRAMUSCULAR | Status: DC | PRN
  Administered 2022-05-01: 60 mg via INTRAVENOUS

## 2022-05-01 MED ORDER — CEFAZOLIN SODIUM-DEXTROSE 2-4 GM/100ML-% IV SOLN
2.0000 g | INTRAVENOUS | Status: AC
  Administered 2022-05-01: 2 g via INTRAVENOUS
  Filled 2022-05-01: qty 100

## 2022-05-01 MED ORDER — KETOROLAC TROMETHAMINE 15 MG/ML IJ SOLN
15.0000 mg | INTRAMUSCULAR | Status: AC
  Administered 2022-05-01: 15 mg via INTRAVENOUS

## 2022-05-01 MED ORDER — FENTANYL CITRATE (PF) 100 MCG/2ML IJ SOLN
INTRAMUSCULAR | Status: AC
  Filled 2022-05-01: qty 2

## 2022-05-01 MED ORDER — HYDROMORPHONE HCL 1 MG/ML IJ SOLN
INTRAMUSCULAR | Status: AC
  Filled 2022-05-01: qty 1

## 2022-05-01 MED ORDER — KETOROLAC TROMETHAMINE 30 MG/ML IJ SOLN
INTRAMUSCULAR | Status: AC
  Filled 2022-05-01: qty 1

## 2022-05-01 MED ORDER — ROCURONIUM BROMIDE 10 MG/ML (PF) SYRINGE
PREFILLED_SYRINGE | INTRAVENOUS | Status: DC | PRN
  Administered 2022-05-01: 20 mg via INTRAVENOUS
  Administered 2022-05-01: 60 mg via INTRAVENOUS

## 2022-05-01 MED ORDER — LACTATED RINGERS IR SOLN
Status: DC | PRN
  Administered 2022-05-01: 1000 mL

## 2022-05-01 MED ORDER — OXYCODONE HCL 5 MG PO TABS
ORAL_TABLET | ORAL | Status: AC
  Filled 2022-05-01: qty 1

## 2022-05-01 MED ORDER — LIDOCAINE HCL (PF) 2 % IJ SOLN
INTRAMUSCULAR | Status: AC
  Filled 2022-05-01: qty 5

## 2022-05-01 SURGICAL SUPPLY — 45 items
ADH SKN CLS APL DERMABOND .7 (GAUZE/BANDAGES/DRESSINGS) ×1
APL PRP STRL LF DISP 70% ISPRP (MISCELLANEOUS) ×1
APPLIER CLIP ROT 10 11.4 M/L (STAPLE) ×1
APR CLP MED LRG 11.4X10 (STAPLE) ×1
BAG COUNTER SPONGE SURGICOUNT (BAG) IMPLANT
BAG SPEC RTRVL 10 TROC 200 (ENDOMECHANICALS) ×1
BAG SPNG CNTER NS LX DISP (BAG)
CABLE HIGH FREQUENCY MONO STRZ (ELECTRODE) ×1 IMPLANT
CATH URETL OPEN 5X70 (CATHETERS) IMPLANT
CHLORAPREP W/TINT 26 (MISCELLANEOUS) ×1 IMPLANT
CLIP APPLIE ROT 10 11.4 M/L (STAPLE) ×1 IMPLANT
COVER MAYO STAND XLG (MISCELLANEOUS) ×1 IMPLANT
COVER SURGICAL LIGHT HANDLE (MISCELLANEOUS) ×1 IMPLANT
DERMABOND ADVANCED .7 DNX12 (GAUZE/BANDAGES/DRESSINGS) ×1 IMPLANT
DRAPE C-ARM 42X120 X-RAY (DRAPES) IMPLANT
ELECT REM PT RETURN 15FT ADLT (MISCELLANEOUS) ×1 IMPLANT
ENDOLOOP SUT PDS II  0 18 (SUTURE) ×2
ENDOLOOP SUT PDS II 0 18 (SUTURE) ×1 IMPLANT
GLOVE BIO SURGEON STRL SZ7.5 (GLOVE) ×1 IMPLANT
GLOVE BIOGEL PI IND STRL 8 (GLOVE) ×1 IMPLANT
GOWN STRL REUS W/ TWL XL LVL3 (GOWN DISPOSABLE) ×2 IMPLANT
GOWN STRL REUS W/TWL XL LVL3 (GOWN DISPOSABLE) ×2
GRASPER SUT TROCAR 14GX15 (MISCELLANEOUS) IMPLANT
HEMOSTAT SNOW SURGICEL 2X4 (HEMOSTASIS) IMPLANT
IRRIG SUCT STRYKERFLOW 2 WTIP (MISCELLANEOUS) ×1
IRRIGATION SUCT STRKRFLW 2 WTP (MISCELLANEOUS) ×1 IMPLANT
IV CATH 14GX2 1/4 (CATHETERS) ×1 IMPLANT
KIT BASIN OR (CUSTOM PROCEDURE TRAY) ×1 IMPLANT
KIT TURNOVER KIT A (KITS) IMPLANT
NDL INSUFFLATION 14GA 120MM (NEEDLE) ×1 IMPLANT
NEEDLE INSUFFLATION 14GA 120MM (NEEDLE) ×1 IMPLANT
PENCIL SMOKE EVACUATOR (MISCELLANEOUS) IMPLANT
POUCH RETRIEVAL ECOSAC 10 (ENDOMECHANICALS) ×1 IMPLANT
POUCH RETRIEVAL ECOSAC 10MM (ENDOMECHANICALS) ×1
SCISSORS LAP 5X35 DISP (ENDOMECHANICALS) ×1 IMPLANT
SET TUBE SMOKE EVAC HIGH FLOW (TUBING) ×1 IMPLANT
SLEEVE Z-THREAD 5X100MM (TROCAR) ×2 IMPLANT
SPIKE FLUID TRANSFER (MISCELLANEOUS) ×1 IMPLANT
STOPCOCK 4 WAY LG BORE MALE ST (IV SETS) IMPLANT
SUT MNCRL AB 4-0 PS2 18 (SUTURE) ×1 IMPLANT
TOWEL OR 17X26 10 PK STRL BLUE (TOWEL DISPOSABLE) ×1 IMPLANT
TOWEL OR NON WOVEN STRL DISP B (DISPOSABLE) IMPLANT
TRAY LAPAROSCOPIC (CUSTOM PROCEDURE TRAY) ×1 IMPLANT
TROCAR ADV FIXATION 12X100MM (TROCAR) ×1 IMPLANT
TROCAR Z-THREAD OPTICAL 5X100M (TROCAR) ×1 IMPLANT

## 2022-05-01 NOTE — Anesthesia Procedure Notes (Signed)
Procedure Name: Intubation Date/Time: 05/01/2022 11:27 AM  Performed by: Lollie Sails, CRNAPre-anesthesia Checklist: Patient identified, Emergency Drugs available, Suction available, Patient being monitored and Timeout performed Patient Re-evaluated:Patient Re-evaluated prior to induction Oxygen Delivery Method: Circle system utilized Preoxygenation: Pre-oxygenation with 100% oxygen Induction Type: IV induction Ventilation: Mask ventilation without difficulty Laryngoscope Size: Miller and 3 Grade View: Grade II Tube type: Oral Tube size: 7.0 mm Number of attempts: 1 Airway Equipment and Method: Stylet Placement Confirmation: ETT inserted through vocal cords under direct vision, positive ETCO2 and breath sounds checked- equal and bilateral Secured at: 22 cm Tube secured with: Tape Dental Injury: Teeth and Oropharynx as per pre-operative assessment

## 2022-05-01 NOTE — Discharge Instructions (Signed)
 CHOLECYSTECTOMY POST OPERATIVE INSTRUCTIONS  Thinking Clearly  The anesthesia may cause you to feel different for 1 or 2 days. Do not drive, drink alcohol, or make any big decisions for at least 2 days.  Nutrition When you wake up, you will be able to drink small amounts of liquid. If you do not feel sick, you can slowly advance your diet to regular foods. Continue to drink lots of fluids, usually about 8 to 10 glasses per day. Eat a high-fiber diet so you don't strain during bowel movements. High-Fiber Foods Foods high in fiber include beans, bran cereals and whole-grain breads, peas, dried fruit (figs, apricots, and dates), raspberries, blackberries, strawberries, sweet corn, broccoli, baked potatoes with skin, plums, pears, apples, greens, and nuts. Activity Slowly increase your activity. Be sure to get up and walk every hour or so to prevent blood clots. No heavy lifting or strenuous activity for 4 weeks following surgery to prevent hernias at your incision sites It is normal to feel tired. You may need more sleep than usual.  Get your rest but make sure to get up and move around frequently to prevent blood clots and pneumonia.  Work and Return to School You can go back to work when you feel well enough. Discuss the timing with your surgeon. You can usually go back to school or work 1 week after an operation. If your work requires heavy lifting or strenuous activity you need to be placed on light duty for 4 weeks following surgery. You can return to gym class, sports or other physical activities 4 weeks after surgery.  Wound Care Always wash your hands before and after touching near your incision site. Do not soak in a bathtub until cleared at your follow up appointment. You may take a shower 24 hours after surgery. A small amount of drainage from the incision is normal. If the drainage is thick and yellow or the site is red, you may have an infection, so call your surgeon. If you  have a drain in one of your incisions, it will be taken out in office when the drainage stops. Steri-Strips will fall off in 7 to 10 days or they will be removed during your first office visit. If you have dermabond glue covering over the incision, allow the glue to flake off on its own. Avoid wearing tight or rough clothing. It may rub your incisions and make it harder for them to heal. Protect the new skin, especially from the sun. The sun can burn and cause darker scarring. Your scar will heal in about 4 to 6 weeks and will become softer and continue to fade over the next year.  The cosmetic appearance of the incisions will improve over the course of the first year after surgery. Sensation around your incision will return in a few weeks or months.  Bowel Movements After intestinal surgery, you may have loose watery stools for several days. If watery diarrhea lasts longer than 3 days, contact your surgeon. Pain medication (narcotics) can cause constipation. Increase the fiber in your diet with high-fiber foods if you are constipated. You can take an over the counter stool softener like Colace to avoid constipation.  Additional over the counter medications can also be used if Colace isn't sufficient (for example, Milk of Magnesia or Miralax).  Pain The amount of pain is different for each person. Some people need only 1 to 3 doses of pain control medication, while others need more. Take alternating doses of tylenol   and ibuprofen around the clock for the first five days following surgery.  This will provide a baseline of pain control and help with inflammation.  Take the narcotic pain medication in addition if needed for severe pain.  Contact Your Surgeon at [phone], if you have: Pain in your right upper abdomen like a gallbladder attack. Pain that will not go away Pain that gets worse A fever of more than 101F (38.3C) Repeated vomiting Swelling, redness, bleeding, or bad-smelling  drainage from your wound site Strong abdominal pain No bowel movement or unable to pass gas for 3 days Watery diarrhea lasting longer than 3 days  Pain Control The goal of pain control is to minimize pain, keep you moving and help you heal. Your surgical team will work with you on your pain plan. Most often a combination of therapies and medications are used to control your pain. You may also be given medication (local anesthetic) at the surgical site. This may help control your pain for several days. Extreme pain puts extra stress on your body at a time when your body needs to focus on healing. Do not wait until your pain has reached a level "10" or is unbearable before telling your doctor or nurse. It is much easier to control pain before it becomes severe. Following a laparoscopic procedure, pain is sometimes felt in the shoulder. This is due to the gas inserted into your abdomen during the procedure. Moving and walking helps to decrease the gas and the right shoulder pain.  Use the guide below for ways to manage your post-operative pain. Learn more by going to facs.org/safepaincontrol.  How Intense Is My Pain Common Therapies to Feel Better       I hardly notice my pain, and it does not interfere with my activities.  I notice my pain and it distracts me, but I can still do activities (sitting up, walking, standing).  Non-Medication Therapies  Ice (in a bag, applied over clothing at the surgical site), elevation, rest, meditation, massage, distraction (music, TV, play) walking and mild exercise Splinting the abdomen with pillows +  Non-Opioid Medications Acetaminophen (Tylenol) Non-steroidal anti-inflammatory drugs (NSAIDS) Aspirin, Ibuprofen (Motrin, Advil) Naproxen (Aleve) Take these as needed, when you feel pain. Both acetaminophen and NSAIDs help to decrease pain and swelling (inflammation).      My pain is hard to ignore and is more noticeable even when I rest.  My  pain interferes with my usual activities.  Non-Medication Therapies  +  Non-Opioid medications  Take on a regular schedule (around-the-clock) instead of as needed. (For example, Tylenol every 6 hours at 9:00 am, 3:00 pm, 9:00 pm, 3:00 am and Motrin every 6 hours at 12:00 am, 6:00 am, 12:00 pm, 6:00 pm)         I am focused on my pain, and I am not doing my daily activities.  I am groaning in pain, and I cannot sleep. I am unable to do anything.  My pain is as bad as it could be, and nothing else matters.  Non-Medication Therapies  +  Around-the-Clock Non-Opioid Medications  +  Short-acting opioids  Opioids should be used with other medications to manage severe pain. Opioids block pain and give a feeling of euphoria (feel high). Addiction, a serious side effect of opioids, is rare with short-term (a few days) use.  Examples of short-acting opioids include: Tramadol (Ultram), Hydrocodone (Norco, Vicodin), Hydromorphone (Dilaudid), Oxycodone (Oxycontin)     The above directions have been adapted from   the American College [employer] Surgical Patient Education Program.  Please refer to the ACS website if needed: https://www.facs.org/-/media/files/education/patient-ed/cholesys.ashx.   Norvell Caswell, MD [facility] [address], [address], [place], [place]  [address] ?  [address] [address], [place], [place]   [address] [phone] ? [phone] ? FAX [phone] Web site: [email or web address]  

## 2022-05-01 NOTE — Anesthesia Preprocedure Evaluation (Addendum)
Anesthesia Evaluation  Patient identified by MRN, date of birth, ID band Patient awake    Reviewed: Allergy & Precautions, H&P , NPO status , Patient's Chart, lab work & pertinent test results  Airway Mallampati: III  TM Distance: >3 FB Neck ROM: Full    Dental no notable dental hx. (+) Teeth Intact, Dental Advisory Given   Pulmonary neg pulmonary ROS   Pulmonary exam normal breath sounds clear to auscultation       Cardiovascular negative cardio ROS  Rhythm:Regular Rate:Normal     Neuro/Psych negative neurological ROS  negative psych ROS   GI/Hepatic Neg liver ROS,GERD  ,,  Endo/Other    Morbid obesity  Renal/GU negative Renal ROS  negative genitourinary   Musculoskeletal   Abdominal   Peds  Hematology negative hematology ROS (+)   Anesthesia Other Findings   Reproductive/Obstetrics negative OB ROS                             Anesthesia Physical Anesthesia Plan  ASA: 2  Anesthesia Plan: General   Post-op Pain Management: Ofirmev IV (intra-op)* and Toradol IV (intra-op)*   Induction: Intravenous  PONV Risk Score and Plan: 4 or greater and Ondansetron, Dexamethasone and Midazolam  Airway Management Planned: Oral ETT  Additional Equipment:   Intra-op Plan:   Post-operative Plan: Extubation in OR  Informed Consent: I have reviewed the patients History and Physical, chart, labs and discussed the procedure including the risks, benefits and alternatives for the proposed anesthesia with the patient or authorized representative who has indicated his/her understanding and acceptance.     Dental advisory given  Plan Discussed with: CRNA  Anesthesia Plan Comments:        Anesthesia Quick Evaluation

## 2022-05-01 NOTE — Transfer of Care (Signed)
Immediate Anesthesia Transfer of Care Note  Patient: Samantha Rollins  Procedure(s) Performed: LAPAROSCOPIC CHOLECYSTECTOMY  Patient Location: PACU  Anesthesia Type:General  Level of Consciousness: awake, alert , and patient cooperative  Airway & Oxygen Therapy: Patient Spontanous Breathing and Patient connected to face mask oxygen  Post-op Assessment: Report given to RN, Post -op Vital signs reviewed and stable, and BP 144/93.  Post vital signs: Reviewed and stable  Last Vitals:  Vitals Value Taken Time  BP    Temp    Pulse 92 05/01/22 1325  Resp 24 05/01/22 1325  SpO2 100 % 05/01/22 1325  Vitals shown include unvalidated device data.  Last Pain:  Vitals:   05/01/22 1045  PainSc: 0-No pain      Patients Stated Pain Goal: 3 ([id number])  Complications: No notable events documented.

## 2022-05-01 NOTE — Anesthesia Postprocedure Evaluation (Signed)
Anesthesia Post Note  Patient: Samantha Rollins  Procedure(s) Performed: LAPAROSCOPIC CHOLECYSTECTOMY     Patient location during evaluation: PACU Anesthesia Type: General Level of consciousness: awake and alert Pain management: pain level controlled Vital Signs Assessment: post-procedure vital signs reviewed and stable Respiratory status: spontaneous breathing, nonlabored ventilation and respiratory function stable Cardiovascular status: blood pressure returned to baseline and stable Postop Assessment: no apparent nausea or vomiting Anesthetic complications: no  No notable events documented.  Last Vitals:  Vitals:   05/01/22 1400 05/01/22 1415  BP:  137/70  Pulse: 84 81  Resp: 19 (!) 21  Temp:    SpO2: 97% 92%    Last Pain:  Vitals:   05/01/22 1400  PainSc: 5                  Leonie Amacher,W. EDMOND

## 2022-05-01 NOTE — H&P (Signed)
Admitting Physician: Nickola Major Marvion Bastidas  Service: General Surgery  CC: Abdominal pain  Subjective   HPI: Samantha Rollins is an 52 y.o. 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    Gallstones    GERD (gastroesophageal reflux disease)    not currently taking medication   H/O bacterial infection    H/O diarrhea 03/02/2004   H/O sickle cell trait    H/O varicella    Herpes simplex type 2 infection    History of gout    Hx of amenorrhea 02/15/2009   Hx of candidiasis    Hx: UTI (urinary tract infection)    Premature menopause 02/15/2009   Pulmonary embolus ([place]) 08/31/2014   in the left lower lobe while taking birth control   Yeast infection     Past Surgical History:  Procedure Laterality Date   COLONOSCOPY     CRYOTHERAPY  03/06/1992   FOOT SURGERY Bilateral 03/06/2002   Bunionectomy   MANDIBLE SURGERY  03/07/1991   MOUTH SURGERY     tooth extraction   WRIST FRACTURE SURGERY Left    rod placed    Family History  Problem Relation Age of Onset   Hypertension Mother    Heart disease Maternal Grandmother        MI    Social:  reports that she has never smoked. She has never used smokeless tobacco. She reports that she does not drink alcohol and does not use drugs.  Allergies: No Known Allergies  Medications: Current Outpatient Medications  Medication Instructions   Multiple Vitamins-Minerals (MULTIVITAMIN WITH MINERALS) tablet 1 tablet, Oral, Daily   ondansetron (ZOFRAN) 8 mg, Oral, Daily PRN   oxyCODONE (ROXICODONE) 5 mg, Oral, Every 4 hours PRN   polyethylene glycol powder (MIRALAX) 17 g, Oral, Daily PRN    ROS - all of the below systems have been reviewed with the patient and positives are indicated with bold text General: chills, fever or night sweats Eyes:  blurry vision or double vision ENT: epistaxis or sore throat Allergy/Immunology: itchy/watery eyes or nasal congestion Hematologic/Lymphatic: bleeding problems, blood clots or swollen lymph nodes Endocrine: temperature intolerance or unexpected weight changes Breast: new or changing breast lumps or nipple discharge Resp: cough, shortness of breath, or wheezing CV: chest pain or dyspnea on exertion GI: as per HPI GU: dysuria, trouble voiding, or hematuria MSK: joint pain or joint stiffness Neuro: TIA or stroke symptoms Derm: pruritus and skin lesion changes Psych: anxiety and depression  Objective   PE Height '5\' 5"'$  (1.651 m), weight 106 kg, last menstrual period 07/31/2014. Constitutional: NAD; conversant; no deformities Eyes: Moist conjunctiva; no lid lag; anicteric; PERRL Neck: Trachea midline; no thyromegaly Lungs: Normal respiratory effort; no tactile fremitus CV: RRR; no palpable thrills; no pitting edema GI: Abd Soft, tender RUQ; no palpable hepatosplenomegaly MSK: Normal range of motion of extremities; no clubbing/cyanosis Psychiatric: Appropriate affect; alert and oriented x3 Lymphatic: No palpable cervical or axillary lymphadenopathy  No results found for this or any previous visit (from the past 24 hour(s)).  Imaging Orders  No imaging studies ordered today  RUQ [place] 04/20/22  Mild cholelithiasis without cholecystitis. No other abnormality seen in the right upper quadrant of the abdomen.  CBD 3 mm    Assessment and Plan     Acute cholecystitis with chronic cholecystitis  [name] has typical symptoms of biliary colic, stones on her right upper quadrant ultrasound and a positive Murphy sign consistent with acute cholecystitis. I recommended laparoscopic cholecystectomy. I recommended completing this urgently due to her constant ongoing pain for the last 2 weeks. We discussed the procedure itself as well as its risk, benefits, and alternatives. After full discussion  all questions answered the patient granted consent to proceed.    No diagnosis found.   Felicie Morn, MD  [facility] Use [email or web address] to contact on call provider

## 2022-05-01 NOTE — Op Note (Signed)
Patient: Samantha Rollins (19-Mar-1970, [id number])  Date of Surgery: 05/01/2022   Preoperative Diagnosis: CHOLECYSTITIS   Postoperative Diagnosis: CHOLECYSTITIS   Surgical Procedure: LAPAROSCOPIC CHOLECYSTECTOMY:    Operative Team Members:  Surgeon(s) and Role:    * Mialani Reicks, Nickola Major, MD - Primary   Blanchie Serve, [place]  Anesthesiologist: Roderic Palau, MD CRNA: Lollie Sails, CRNA; Gerald Leitz, CRNA   Anesthesia: General   Fluids:  Total I/O In: 1100 [I.V.:1000; IV Piggyback:100] Out: 10 [id number]  Complications: None  Drains:  none   Specimen:  ID Type Source Tests Collected by Time Destination  1 : gallbladder Tissue PATH Other SURGICAL PATHOLOGY Nalea Salce, Nickola Major, MD 05/01/2022 1134      Disposition:  PACU - hemodynamically stable.  Plan of Care: Discharge to home after PACU    Indications for Procedure: Samantha Rollins is a 52 y.o. female who presented with abdominal pain and gallstones.  History, physical and imaging was concerning for acute cholecystitis.  Laparoscopic cholecystectomy was recommended for the patient.  The procedure itself, as well as the risks, benefits and alternatives were discussed with the patient.  Risks discussed included but were not limited to the risk of infection, bleeding, damage to nearby structures, need to convert to open procedure, incisional hernia, bile leak, common bile duct injury and the need for additional procedures or surgeries.  With this discussion complete and all questions answered the patient granted consent to proceed.  Findings: Inflamed gallbladder  Infection status: Patient: Private Patient Elective Case Case: Urgent Infection Present At Time Of Surgery (PATOS):  Spillage of bile   Description of Procedure:   On the date stated above, the patient was taken to the operating room suite and placed in supine positioning.  Sequential compression devices were placed on the  lower extremities to prevent blood clots.  General endotracheal anesthesia was induced. Preoperative antibiotics were given.  The patient's abdomen was prepped and draped in the usual sterile fashion.  A time-out was completed verifying the correct patient, procedure, positioning and equipment needed for the case.  We began by anesthetizing the skin with local anesthetic and then making a 5 mm incision just below the umbilicus.  We dissected through the subcutaneous tissues to the fascia.  The fascia was grasped and elevated using a Kocher clamp.  A Veress needle was inserted into the abdomen and the abdomen was insufflated to 15 mmHg.  A 5 mm trocar was inserted in this position under optical guidance and then the abdomen was inspected.  There was no trauma to the underlying viscera with initial trocar placement.  Any abnormal findings, other than inflammation in the right upper quadrant, are listed above in the findings section.  Three additional trocars were placed, one 12 mm trocar in the subxiphoid position, one 5 mm trocar in the midline epigastric area and one 56m trocar in the right upper quadrant subcostally.  These were placed under direct vision without any trauma to the underlying viscera.    The patient was then placed in head up, left side down positioning.  The gallbladder was identified and dissected free from its attachments to the omentum allowing the duodenum to fall away.  The infundibulum of the gallbladder was dissected free working laterally to medially. At first I thought I was dissecting the infundibulum but realized I was higher up on the gallbladder.  The infundibulum was twisted some. I continued to work down to the infundibulum and identified the cystic  duct.  The cystic duct and cystic artery were dissected free from surrounding connective tissue.  The infundibulum of the gallbladder was dissected off the cystic plate.  A critical view of safety was obtained with the cystic duct and  cystic artery being cleared of connective tissues and clearly the only two structures entering into the gallbladder with the liver clearly visible behind.  Clips were then applied to the cystic artery and it was divided.  I attempted to clip the cystic duct but it was quite short and had stones in it.  I milked the stones backwards and applied clips to the cystic duct and cystic artery.  It did not appear these would be effective.  I then performed a dome down dissection of the cystic plate down to the cystic duct.  Two PDS endoloops were placed on the cystic duct.  The cystic duct was divided between the two PDS endoloops.  The gallbladder was placed in an endocatch bag and removed from the 12 mm subxiphoid port site.  The clips were inspected and appeared effective.  The cystic plate was inspected and hemostasis was obtained using electrocautery.  A suction irrigator was used to clean the operative field.  A few small stones were spilled and removed from the abdomen with the stone scoop.  Attention was turned to closure.  The 12 mm subxiphoid port site was closed using a 0-vicryl suture on a fascial suture passer.  The abdomen was desufflated.  The skin was closed using 4-0 monocryl and dermabond.  All sponge and needle counts were correct at the conclusion of the case.    Louanna Raw, MD General, Bariatric, & Minimally Invasive Surgery [facility], [place]

## 2022-05-02 ENCOUNTER — Encounter (HOSPITAL_COMMUNITY): Payer: Self-pay | Admitting: Surgery

## 2022-05-02 LAB — SURGICAL PATHOLOGY

## 2023-01-19 ENCOUNTER — Other Ambulatory Visit: Payer: Self-pay | Admitting: Obstetrics and Gynecology

## 2023-01-19 DIAGNOSIS — Z0000 Encounter for general adult medical examination without abnormal findings: Secondary | ICD-10-CM

## 2023-02-16 ENCOUNTER — Ambulatory Visit
Admission: RE | Admit: 2023-02-16 | Discharge: 2023-02-16 | Disposition: A | Discharge status: Home / Self Care | Payer: No Typology Code available for payment source | Source: Ambulatory Visit | Attending: Obstetrics and Gynecology | Admitting: Obstetrics and Gynecology

## 2023-02-16 DIAGNOSIS — Z0000 Encounter for general adult medical examination without abnormal findings: Secondary | ICD-10-CM

## 2023-07-02 IMAGING — MG MM DIGITAL SCREENING BILAT W/ TOMO AND CAD
6 of 11 series · 6 of 31 positions shown · non-contrast
Comparison: Previous exam(s).

ACR Breast Density Category a: The breast tissue is almost entirely
fatty.

CLINICAL DATA: Screening.

EXAM:
DIGITAL SCREENING BILATERAL MAMMOGRAM WITH TOMOSYNTHESIS AND CAD
TECHNIQUE: Bilateral screening digital craniocaudal and mediolateral oblique
mammograms were obtained. Bilateral screening digital breast
tomosynthesis was performed. The images were evaluated with
computer-aided detection.

[R MLO synth-2D]
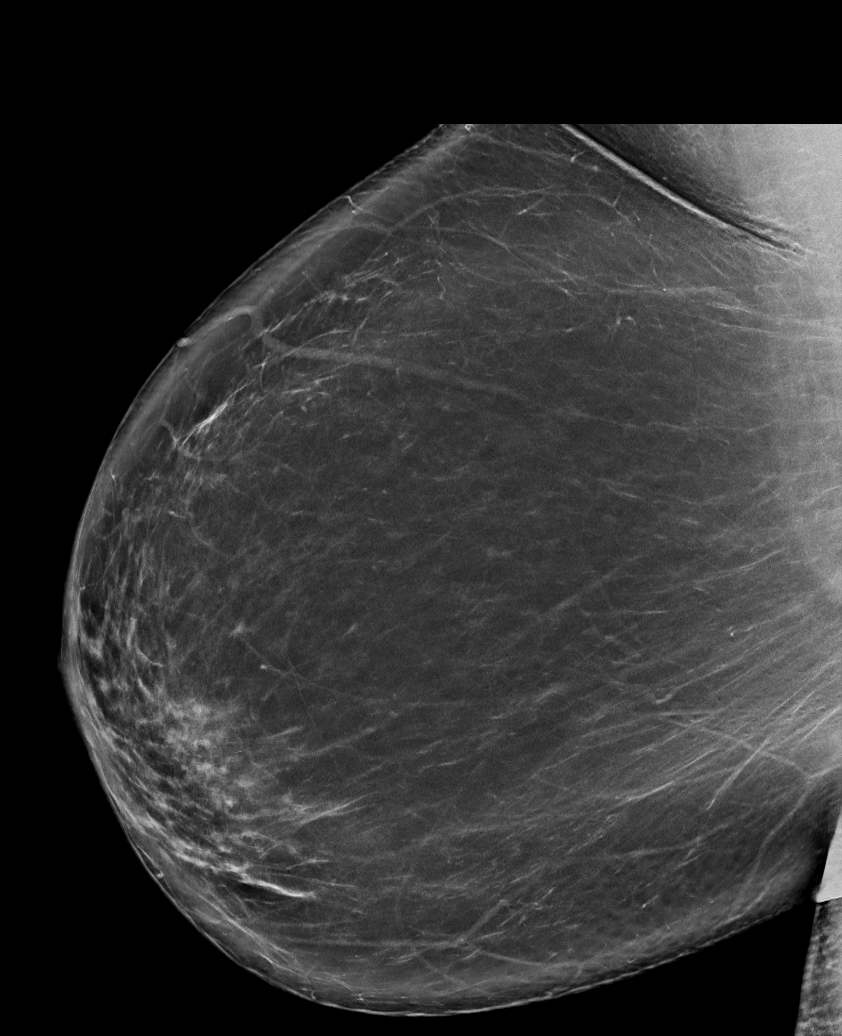

[L MLO synth-2D (1 of 2)]
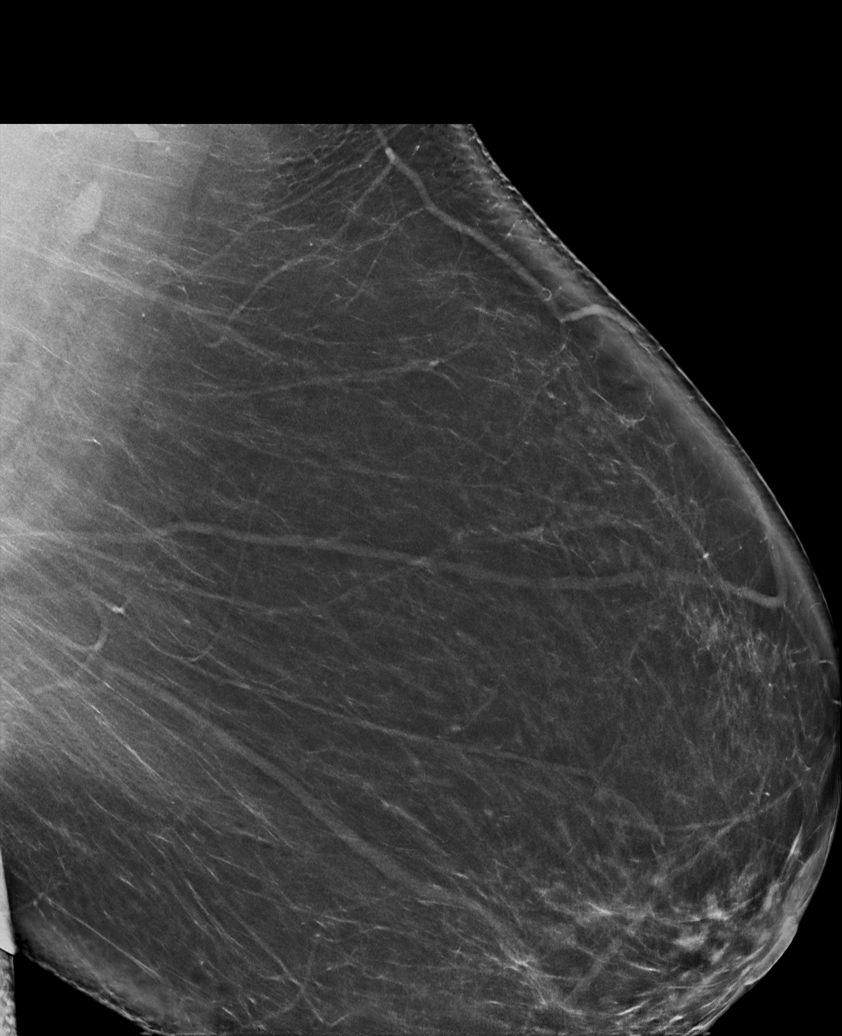

[L CC synth-2D]
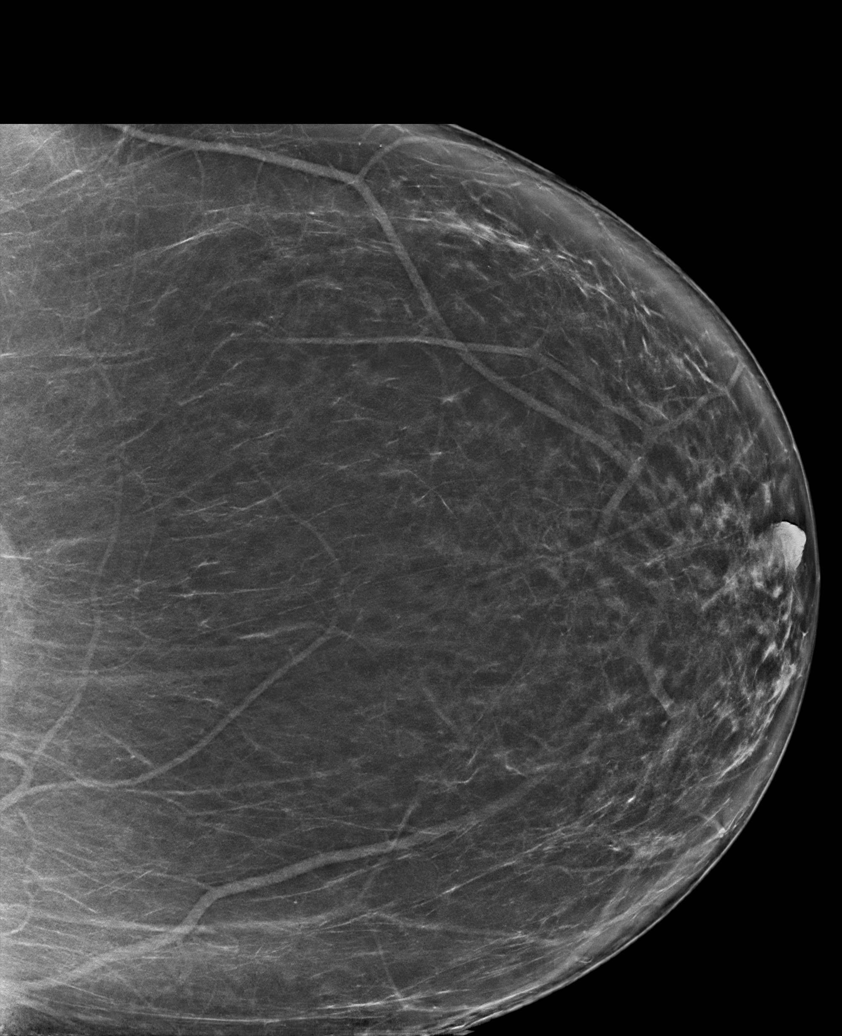

[R CV synth-2D]
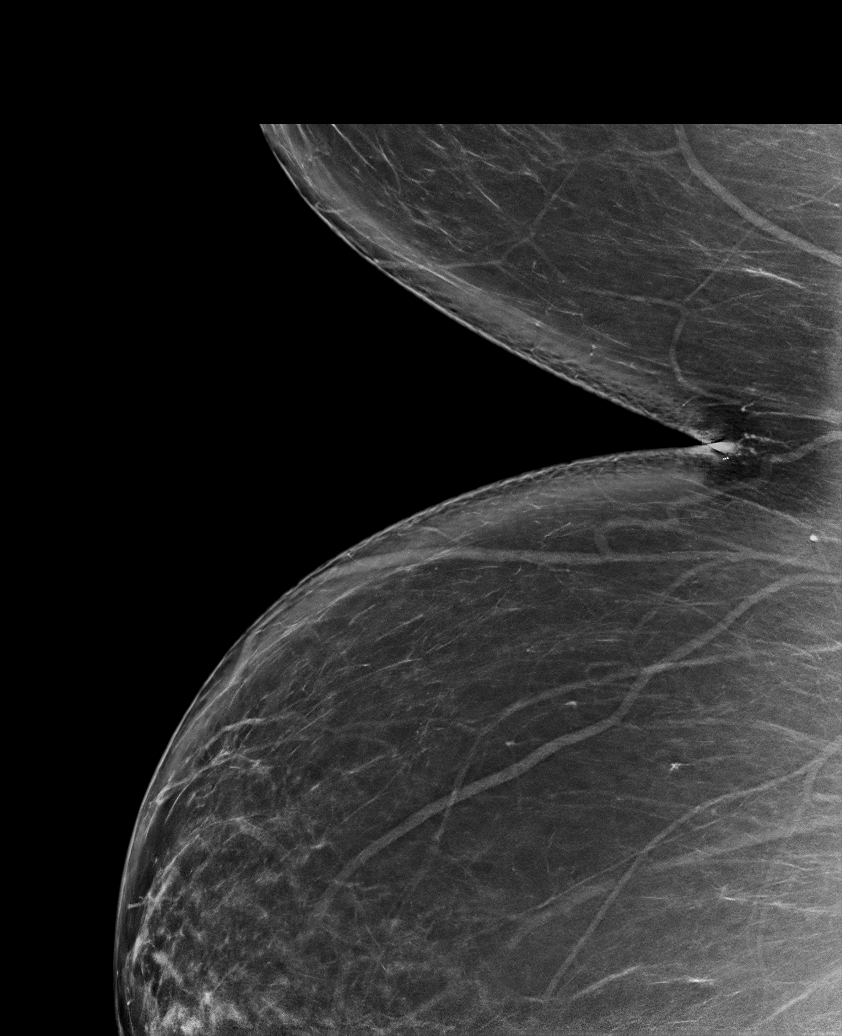

[L MLO synth-2D (2 of 2)]
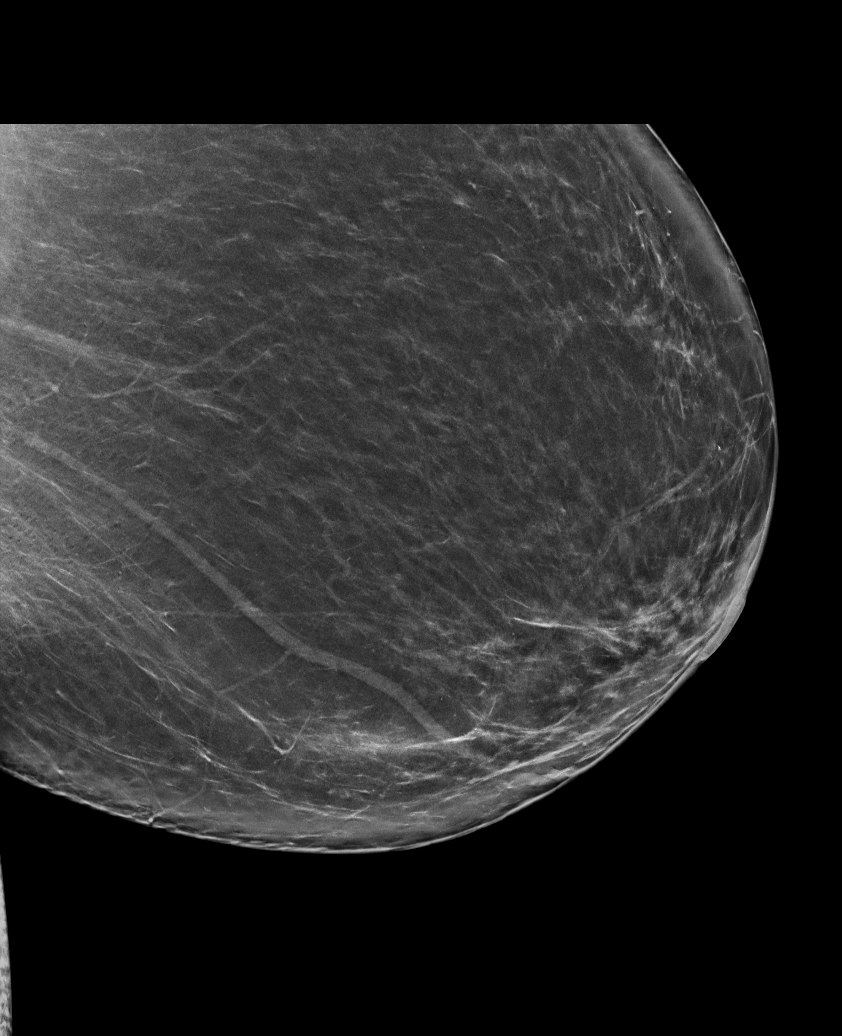

[R CC synth-2D]
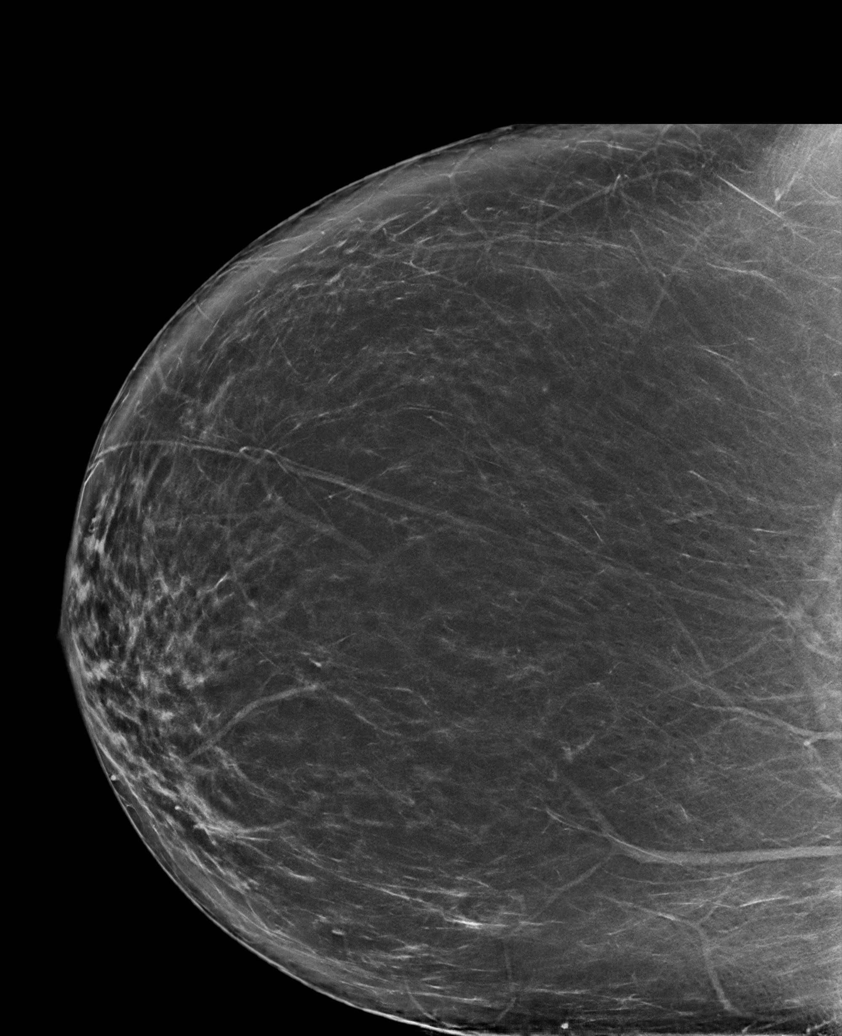

[6 of 31 positions shown; findings below may reference images not displayed]

FINDINGS: There are no findings suspicious for malignancy.
IMPRESSION: No mammographic evidence of malignancy. A result letter of this
screening mammogram will be mailed directly to the patient.

RECOMMENDATION:
Screening mammogram in one year. (Code:0E-3-N98)

BI-RADS CATEGORY  1: Negative.

## 2023-11-08 ENCOUNTER — Inpatient Hospital Stay

## 2023-11-08 ENCOUNTER — Inpatient Hospital Stay: Attending: Hematology and Oncology | Admitting: Hematology and Oncology

## 2023-11-08 DIAGNOSIS — Z87440 Personal history of urinary (tract) infections: Secondary | ICD-10-CM | POA: Insufficient documentation

## 2023-11-08 DIAGNOSIS — Z86718 Personal history of other venous thrombosis and embolism: Secondary | ICD-10-CM | POA: Insufficient documentation

## 2023-11-08 DIAGNOSIS — M109 Gout, unspecified: Secondary | ICD-10-CM | POA: Insufficient documentation

## 2023-11-08 DIAGNOSIS — N951 Menopausal and female climacteric states: Secondary | ICD-10-CM | POA: Diagnosis not present

## 2023-11-08 DIAGNOSIS — Z86711 Personal history of pulmonary embolism: Secondary | ICD-10-CM | POA: Diagnosis not present

## 2023-11-08 DIAGNOSIS — D573 Sickle-cell trait: Secondary | ICD-10-CM | POA: Diagnosis not present

## 2023-11-08 DIAGNOSIS — Z793 Long term (current) use of hormonal contraceptives: Secondary | ICD-10-CM | POA: Insufficient documentation

## 2023-11-08 DIAGNOSIS — N938 Other specified abnormal uterine and vaginal bleeding: Secondary | ICD-10-CM | POA: Diagnosis not present

## 2023-11-08 DIAGNOSIS — I2699 Other pulmonary embolism without acute cor pulmonale: Secondary | ICD-10-CM

## 2023-11-08 NOTE — Progress Notes (Signed)
 [facility] CONSULT NOTE  Patient Care Team: Seabron Lenis, MD as PCP - General (Family Medicine)  CHIEF COMPLAINTS/PURPOSE OF CONSULTATION:  Consultation regarding her risk of blood clots if she were to go on hormone replacement therapy  HISTORY OF PRESENTING ILLNESS:   History of Present Illness Samantha Rollins is a 53 year old female who presents with menopausal symptoms and spotting. She was referred by Dr. Conan for evaluation of menopausal symptoms and spotting.  In 2016, she experienced a pulmonary embolism attributed to oral contraceptive use during perimenopause. She discontinued the birth control and was treated with Xarelto  for six months. A comprehensive workup, including tests for gene mutations and clotting disorders, was negative.  She experiences spotting that began late last year, occurring almost like a cycle but lighter. The spotting persists for about 20 days each month, requiring a panty liner, and then subsides for 5 to 7 days before returning. A vaginal ultrasound and hysteroscopy were performed, and she recalls being told that the results were fine, but the spotting continues.  She is concerned about the potential risks of hormone replacement therapy due to her history of blood clots. She is primarily concerned about the persistent spotting.    I reviewed her records extensively and collaborated the history with the patient.   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    Gallstones    GERD (gastroesophageal reflux disease)    not currently taking medication   H/O bacterial infection    H/O diarrhea 03/02/2004   H/O sickle cell trait    H/O varicella    Herpes simplex type 2 infection    History of gout    Hx of amenorrhea 02/15/2009   Hx of candidiasis    Hx: UTI (urinary  tract infection)    Premature menopause 02/15/2009   Pulmonary embolus ([facility]) 08/31/2014   in the left lower lobe while taking birth control   Yeast infection     SURGICAL HISTORY: Past Surgical History:  Procedure Laterality Date   CHOLECYSTECTOMY N/A 05/01/2022   Procedure: LAPAROSCOPIC CHOLECYSTECTOMY;  Surgeon: Lyndel Deward PARAS, MD;  Location: [facility];  Service: General;  Laterality: N/A;   COLONOSCOPY     CRYOTHERAPY  03/06/1992   FOOT SURGERY Bilateral 03/06/2002   Bunionectomy   MANDIBLE SURGERY  03/07/1991   MOUTH SURGERY     tooth extraction   WRIST FRACTURE SURGERY Left    rod placed    SOCIAL HISTORY: Social History   Socioeconomic History   Marital status: Married    Spouse name: Not on file   Number of children: Not on file   Years of education: Not on file   Highest education level: Not on file  Occupational History   Not on file  Tobacco Use   Smoking status: Never   Smokeless tobacco: Never  Vaping Use   Vaping status: Never Used  Substance and Sexual Activity   Alcohol use: No   Drug use: No   Sexual activity: Yes    Birth control/protection: Pill    Comment: orthotricyclen  Other Topics Concern   Not on file  Social History Narrative   Not on file   Social Drivers of Health   Financial Resource Strain: Not on file  Food Insecurity: Not on file  Transportation Needs: Not on file  Physical Activity: Not on file  Stress: Not on file  Social Connections: Unknown (04/14/2022)   Received from [facility]   Social Network    Social Network: Not on file  Intimate Partner Violence: Unknown (04/14/2022)   Received from [facility]   HITS    Physically Hurt: Not on file    Insult or Talk Down To: Not on file    Threaten Physical Harm: Not on file    Scream or Curse: Not on file    FAMILY HISTORY: Family History  Problem Relation Age of Onset   Hypertension Mother    Heart disease Maternal Grandmother        MI    ALLERGIES:  has no  known allergies.  MEDICATIONS:  Current Outpatient Medications  Medication Sig Dispense Refill   Multiple Vitamins-Minerals (MULTIVITAMIN WITH MINERALS) tablet Take 1 tablet by mouth daily.     No current facility-administered medications for this visit.    REVIEW OF SYSTEMS:   Constitutional: Denies fevers, chills or abnormal night sweats All other systems were reviewed with the patient and are negative.  PHYSICAL EXAMINATION: ECOG PERFORMANCE STATUS: 0 - Asymptomatic  GENERAL:alert, no distress and comfortable  LABORATORY DATA:  I have reviewed the data as listed Lab Results  Component Value Date   WBC 7.9 04/20/2022   HGB 13.3 04/20/2022   HCT 40.8 04/20/2022   MCV 85.7 04/20/2022   PLT 354 04/20/2022   Lab Results  Component Value Date   NA 142 04/20/2022   K 4.2 04/20/2022   CL 105 04/20/2022   CO2 24 04/20/2022   .  ASSESSMENT AND PLAN:   Determining the risk of thrombosis: Baseline Caprini risk assessment for DVT: 5 points (risk level: Very high) greater than 6% risk per year  if she were to take HRT: It would be 6 points. Although transdermal HRT does not significantly increase the risk of blood clots, I would still recommend avoiding any additional added risk given the fact that previously her PE was developed after birth control was restarted.  Prior workup was negative for hypercoagulability risk factors: It was a very thorough workup performed at that time. Modifiable risk factors: Her BMI of 34 is a risk factor and if she was to lose weight then her risk would reduce. Also recommended compression socks for any long distance travel.  Return to clinic on an as-needed basis All questions were answered. The patient knows to call the clinic with any problems, questions or concerns.    Viinay K Jaquelin Meaney, MD 11/08/23

## 2024-02-04 ENCOUNTER — Other Ambulatory Visit: Payer: Self-pay | Admitting: Obstetrics and Gynecology

## 2024-02-04 DIAGNOSIS — Z1231 Encounter for screening mammogram for malignant neoplasm of breast: Secondary | ICD-10-CM

## 2024-02-29 ENCOUNTER — Ambulatory Visit
Admission: RE | Admit: 2024-02-29 | Discharge: 2024-02-29 | Disposition: A | Discharge status: Home / Self Care | Source: Ambulatory Visit | Attending: Obstetrics and Gynecology | Admitting: Obstetrics and Gynecology

## 2024-02-29 DIAGNOSIS — Z1231 Encounter for screening mammogram for malignant neoplasm of breast: Secondary | ICD-10-CM
